# Patient Record
Sex: Male | Born: 1965 | Race: White | Hispanic: No | Marital: Married | Smoking: Former smoker
Health system: Southern US, Community
[De-identification: ages and names within clinical notes are randomized; demographics above are authoritative.]

## PROBLEM LIST (undated history)

## (undated) DIAGNOSIS — C439 Malignant melanoma of skin, unspecified: Secondary | ICD-10-CM

## (undated) DIAGNOSIS — N529 Male erectile dysfunction, unspecified: Secondary | ICD-10-CM

## (undated) DIAGNOSIS — R41840 Attention and concentration deficit: Secondary | ICD-10-CM

## (undated) DIAGNOSIS — F329 Major depressive disorder, single episode, unspecified: Secondary | ICD-10-CM

## (undated) DIAGNOSIS — C801 Malignant (primary) neoplasm, unspecified: Secondary | ICD-10-CM

## (undated) DIAGNOSIS — Z87448 Personal history of other diseases of urinary system: Secondary | ICD-10-CM

## (undated) DIAGNOSIS — F419 Anxiety disorder, unspecified: Secondary | ICD-10-CM

## (undated) DIAGNOSIS — F32A Depression, unspecified: Secondary | ICD-10-CM

## (undated) DIAGNOSIS — F909 Attention-deficit hyperactivity disorder, unspecified type: Secondary | ICD-10-CM

## (undated) DIAGNOSIS — K219 Gastro-esophageal reflux disease without esophagitis: Secondary | ICD-10-CM

## (undated) DIAGNOSIS — M722 Plantar fascial fibromatosis: Secondary | ICD-10-CM

## (undated) HISTORY — PX: FRACTURE SURGERY: SHX138

## (undated) HISTORY — PX: MELANOMA EXCISION: SHX5266

## (undated) HISTORY — PX: BACK SURGERY: SHX140

## (undated) HISTORY — PX: OTHER SURGICAL HISTORY: SHX169

---
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
## 2006-01-24 ENCOUNTER — Ambulatory Visit: Payer: Self-pay | Admitting: Orthopedic Surgery

## 2014-07-07 DIAGNOSIS — N529 Male erectile dysfunction, unspecified: Secondary | ICD-10-CM | POA: Insufficient documentation

## 2014-07-07 DIAGNOSIS — Z85820 Personal history of malignant melanoma of skin: Secondary | ICD-10-CM | POA: Insufficient documentation

## 2014-07-07 DIAGNOSIS — M170 Bilateral primary osteoarthritis of knee: Secondary | ICD-10-CM | POA: Insufficient documentation

## 2014-07-07 DIAGNOSIS — Z87448 Personal history of other diseases of urinary system: Secondary | ICD-10-CM | POA: Insufficient documentation

## 2015-09-07 ENCOUNTER — Encounter: Payer: Self-pay | Admitting: *Deleted

## 2015-09-08 ENCOUNTER — Ambulatory Visit: Payer: BC Managed Care – PPO | Admitting: Anesthesiology

## 2015-09-08 ENCOUNTER — Encounter
Admission: RE | Disposition: A | Discharge status: Home / Self Care | Payer: Self-pay | Source: Ambulatory Visit | Attending: Gastroenterology

## 2015-09-08 ENCOUNTER — Ambulatory Visit
Admission: RE | Admit: 2015-09-08 | Discharge: 2015-09-08 | Disposition: A | Discharge status: Home / Self Care | Payer: BC Managed Care – PPO | Source: Ambulatory Visit | Attending: Gastroenterology | Admitting: Gastroenterology

## 2015-09-08 DIAGNOSIS — Z87891 Personal history of nicotine dependence: Secondary | ICD-10-CM | POA: Insufficient documentation

## 2015-09-08 DIAGNOSIS — R1310 Dysphagia, unspecified: Secondary | ICD-10-CM | POA: Diagnosis not present

## 2015-09-08 DIAGNOSIS — K5730 Diverticulosis of large intestine without perforation or abscess without bleeding: Secondary | ICD-10-CM | POA: Diagnosis not present

## 2015-09-08 DIAGNOSIS — K2100 Gastro-esophageal reflux disease with esophagitis, without bleeding: Secondary | ICD-10-CM | POA: Insufficient documentation

## 2015-09-08 DIAGNOSIS — R197 Diarrhea, unspecified: Secondary | ICD-10-CM | POA: Insufficient documentation

## 2015-09-08 DIAGNOSIS — N529 Male erectile dysfunction, unspecified: Secondary | ICD-10-CM | POA: Insufficient documentation

## 2015-09-08 DIAGNOSIS — Z79899 Other long term (current) drug therapy: Secondary | ICD-10-CM | POA: Insufficient documentation

## 2015-09-08 DIAGNOSIS — Z85820 Personal history of malignant melanoma of skin: Secondary | ICD-10-CM | POA: Diagnosis not present

## 2015-09-08 DIAGNOSIS — R159 Full incontinence of feces: Secondary | ICD-10-CM | POA: Insufficient documentation

## 2015-09-08 HISTORY — DX: Malignant (primary) neoplasm, unspecified: C80.1

## 2015-09-08 HISTORY — DX: Personal history of other diseases of urinary system: Z87.448

## 2015-09-08 HISTORY — DX: Male erectile dysfunction, unspecified: N52.9

## 2015-09-08 HISTORY — PX: ESOPHAGOGASTRODUODENOSCOPY (EGD) WITH PROPOFOL: SHX5813

## 2015-09-08 HISTORY — PX: COLONOSCOPY WITH PROPOFOL: SHX5780

## 2015-09-08 HISTORY — DX: Plantar fascial fibromatosis: M72.2

## 2015-09-08 SURGERY — COLONOSCOPY WITH PROPOFOL
Anesthesia: General

## 2015-09-08 MED ORDER — PROPOFOL INFUSION 10 MG/ML OPTIME
INTRAVENOUS | Status: DC | PRN
  Administered 2015-09-08: 160 ug/kg/min via INTRAVENOUS

## 2015-09-08 MED ORDER — SODIUM CHLORIDE 0.9 % IV SOLN: INTRAVENOUS | Status: DC

## 2015-09-08 MED ORDER — MIDAZOLAM HCL 2 MG/2ML IJ SOLN
INTRAMUSCULAR | Status: DC | PRN
  Administered 2015-09-08: 1 mg via INTRAVENOUS

## 2015-09-08 MED ORDER — DIPHENHYDRAMINE HCL 50 MG/ML IJ SOLN
INTRAMUSCULAR | Status: AC
  Filled 2015-09-08: qty 1

## 2015-09-08 MED ORDER — SODIUM CHLORIDE 0.9 % IV SOLN
INTRAVENOUS | Status: DC
  Administered 2015-09-08: 11:00:00 via INTRAVENOUS

## 2015-09-08 MED ORDER — FENTANYL CITRATE (PF) 100 MCG/2ML IJ SOLN
INTRAMUSCULAR | Status: DC | PRN
  Administered 2015-09-08: 50 ug via INTRAVENOUS

## 2015-09-08 NOTE — Anesthesia Preprocedure Evaluation (Signed)
Anesthesia Evaluation  Patient identified by MRN, date of birth, ID band Patient awake    Reviewed: Allergy & Precautions, NPO status , Patient's Chart, lab work & pertinent test results  Airway Mallampati: II       Dental no notable dental hx.    Pulmonary neg pulmonary ROS, former smoker,    Pulmonary exam normal        Cardiovascular Normal cardiovascular exam     Neuro/Psych    GI/Hepatic negative GI ROS, Neg liver ROS,   Endo/Other  negative endocrine ROS  Renal/GU negative Renal ROS     Musculoskeletal negative musculoskeletal ROS (+)   Abdominal Normal abdominal exam  (+)   Peds negative pediatric ROS (+)  Hematology negative hematology ROS (+)   Anesthesia Other Findings   Reproductive/Obstetrics negative OB ROS                             Anesthesia Physical Anesthesia Plan  ASA: II  Anesthesia Plan: General   Post-op Pain Management:    Induction: Intravenous  Airway Management Planned: Nasal Cannula  Additional Equipment:   Intra-op Plan:   Post-operative Plan:   Informed Consent: I have reviewed the patients History and Physical, chart, labs and discussed the procedure including the risks, benefits and alternatives for the proposed anesthesia with the patient or authorized representative who has indicated his/her understanding and acceptance.     Plan Discussed with: CRNA  Anesthesia Plan Comments:         Anesthesia Quick Evaluation

## 2015-09-08 NOTE — Op Note (Signed)
[facility] Patient Name: Alec Hughes Procedure Date: 09/08/2015 10:58 AM MRN: [id number] Account #: [id number] Date of Birth: Aug 29, 1966 Admit Type: Outpatient Age: 49 Room: [facility] ENDO ROOM 3 Gender: Male Note Status: Finalized Procedure:         Colonoscopy Indications:       Chronic diarrhea Providers:         Lupita Dawn. Candace Cruise, MD Referring MD:      Christena Flake. Raechel Ache, MD (Referring MD) Medicines:         Monitored Anesthesia Care Complications:     No immediate complications. Procedure:         Pre-Anesthesia Assessment:                    - Prior to the procedure, a History and Physical was                     performed, and patient medications, allergies and                     sensitivities were reviewed. The patient's tolerance of                     previous anesthesia was reviewed.                    - The risks and benefits of the procedure and the sedation                     options and risks were discussed with the patient. All                     questions were answered and informed consent was obtained.                    - After reviewing the risks and benefits, the patient was                     deemed in satisfactory condition to undergo the procedure.                    After obtaining informed consent, the colonoscope was                     passed under direct vision. Throughout the procedure, the                     patient's blood pressure, pulse, and oxygen saturations                     were monitored continuously. The Colonoscope was                     introduced through the anus and advanced to the the cecum,                     identified by appendiceal orifice and ileocecal valve. The                     colonoscopy was performed without difficulty. The patient                     tolerated the procedure well. The quality of the bowel  preparation was good. Findings:      A few small-mouthed  diverticula were found in the sigmoid colon.       Biopsies for histology were taken with a cold forceps from the entire       colon for evaluation of microscopic colitis.      The exam was otherwise without abnormality. Impression:        - Diverticulosis in the sigmoid colon. Biopsied.                    - The examination was otherwise normal. Recommendation:    - Discharge patient to home.                    - Await pathology results.                    - The findings and recommendations were discussed with the                     patient.                    - The findings and recommendations were discussed with the                     patient. Procedure Code(s): --- Professional ---                    [phone], Colonoscopy, flexible; with biopsy, single or                     multiple Diagnosis Code(s): --- Professional ---                    K52.9, Noninfective gastroenteritis and colitis,                     unspecified                    K57.30, Diverticulosis of large intestine without                     perforation or abscess without bleeding CPT copyright 2014 American Medical Association. All rights reserved. The codes documented in this report are preliminary and upon coder review may  be revised to meet current compliance requirements. Hulen Luster, MD 09/08/2015 11:25:04 AM This report has been signed electronically. Number of Addenda: 0 Note Initiated On: 09/08/2015 10:58 AM Scope Withdrawal Time: 0 hours 5 minutes 48 seconds  Total Procedure Duration: 0 hours 9 minutes 28 seconds       [facility]

## 2015-09-08 NOTE — Transfer of Care (Signed)
Immediate Anesthesia Transfer of Care Note  Patient: Alec Hughes  Procedure(s) Performed: Procedure(s): COLONOSCOPY WITH PROPOFOL (N/A) ESOPHAGOGASTRODUODENOSCOPY (EGD) WITH PROPOFOL (N/A)  Patient Location: PACU and Endoscopy Unit  Anesthesia Type:General  Level of Consciousness: sedated and responds to stimulation  Airway & Oxygen Therapy: Patient Spontanous Breathing and Patient connected to nasal cannula oxygen  Post-op Assessment: Report given to RN and Post -op Vital signs reviewed and stable  Post vital signs: Reviewed and stable  Last Vitals:  Filed Vitals:   09/08/15 1150  BP: 104/72  Pulse: 70  Temp:   Resp: 18    Complications: No apparent anesthesia complications

## 2015-09-08 NOTE — H&P (Signed)
    Primary Care Physician:  Ezequiel Kayser, MD Primary Gastroenterologist:  Dr. Candace Cruise  Pre-Procedure History & Physical: HPI:  Alec Hughes is a 49 y.o. male is here for an EGD/colonoscpy.   Past Medical History  Diagnosis Date  . Cancer     malignant melanoma of skin  . Plantar fasciitis   . Erectile dysfunction of organic origin   . History of hematuria     Past Surgical History  Procedure Laterality Date  . Melanoma excision    . Broken vertebrae    . Orif right femur    . Orif left wrist      Prior to Admission medications   Medication Sig Start Date End Date Taking? Authorizing Provider  buPROPion (WELLBUTRIN XL) 300 MG 24 hr tablet Take 300 mg by mouth daily.   Yes Historical Provider, MD  Ibuprofen-Diphenhydramine Cit (ADVIL PM PO) Take by mouth.   Yes Historical Provider, MD  sildenafil (VIAGRA) 50 MG tablet Take 50 mg by mouth daily as needed for erectile dysfunction.   Yes Historical Provider, MD  zolpidem (AMBIEN) 5 MG tablet Take 5 mg by mouth at bedtime as needed for sleep.   Yes Historical Provider, MD    Allergies as of 08/04/2015  . (Not on File)    Hughes family history on file.  Social History   Social History  . Marital Status: Unknown    Spouse Name: N/A  . Number of Children: N/A  . Years of Education: N/A   Occupational History  . Not on file.   Social History Main Topics  . Smoking status: Former Research scientist (life sciences)  . Smokeless tobacco: Not on file  . Alcohol Use: Not on file  . Drug Use: Not on file  . Sexual Activity: Not on file   Other Topics Concern  . Not on file   Social History Narrative  . Hughes narrative on file    Review of Systems: See HPI, otherwise negative ROS  Physical Exam: BP 120/79 mmHg  Pulse 66  Temp(Src) 96.2 F (35.7 C) (Tympanic)  Resp 20  Ht 6' (1.829 m)  Wt 99.791 kg (220 lb)  BMI 29.83 kg/m2  SpO2 98% General:   Alert,  pleasant and cooperative in NAD Head:  Normocephalic and atraumatic. Neck:  Supple; Hughes  masses or thyromegaly. Lungs:  Clear throughout to auscultation.    Heart:  Regular rate and rhythm. Abdomen:  Soft, nontender and nondistended. Normal bowel sounds, without guarding, and without rebound.   Neurologic:  Alert and  oriented x4;  grossly normal neurologically.  Impression/Plan: Alec Hughes is here for an EGD/colonoscopy to be performed for diarrhea/fecal incontinence.  Risks, benefits, limitations, and alternatives regarding EGD/colonoscopy have been reviewed with the patient.  Questions have been answered.  All parties agreeable.   Alec Hughes, Alec Dawn, MD  09/08/2015, 11:01 AM

## 2015-09-08 NOTE — Anesthesia Postprocedure Evaluation (Signed)
  Anesthesia Post-op Note  Patient: Alec Hughes  Procedure(s) Performed: Procedure(s): COLONOSCOPY WITH PROPOFOL (N/A) ESOPHAGOGASTRODUODENOSCOPY (EGD) WITH PROPOFOL (N/A)  Anesthesia type:General  Patient location: PACU  Post pain: Pain level controlled  Post assessment: Post-op Vital signs reviewed, Patient's Cardiovascular Status Stable, Respiratory Function Stable, Patent Airway and No signs of Nausea or vomiting  Post vital signs: Reviewed and stable  Last Vitals:  Filed Vitals:   09/08/15 1210  BP: 122/83  Pulse: 57  Temp:   Resp: 12    Level of consciousness: awake, alert  and patient cooperative  Complications: No apparent anesthesia complications

## 2015-09-08 NOTE — Op Note (Signed)
[facility] Patient Name: Alec Hughes Procedure Date: 09/08/2015 10:59 AM MRN: [id number] Account #: [id number] Date of Birth: [birth year] Admit Type: Outpatient Age: 49 Room: [facility] ENDO ROOM 3 Gender: Male Note Status: Finalized Procedure:         Upper GI endoscopy Indications:       Dysphagia Providers:         Lupita Dawn. Candace Cruise, MD Referring MD:      Christena Flake. Raechel Ache, MD (Referring MD) Medicines:         Monitored Anesthesia Care Complications:     No immediate complications. Procedure:         Pre-Anesthesia Assessment:                    - Prior to the procedure, a History and Physical was                     performed, and patient medications, allergies and                     sensitivities were reviewed. The patient's tolerance of                     previous anesthesia was reviewed.                    - The risks and benefits of the procedure and the sedation                     options and risks were discussed with the patient. All                     questions were answered and informed consent was obtained.                    After obtaining informed consent, the endoscope was passed                     under direct vision. Throughout the procedure, the                     patient's blood pressure, pulse, and oxygen saturations                     were monitored continuously. The Endoscope was introduced                     through the mouth, and advanced to the second part of                     duodenum. The upper GI endoscopy was accomplished without                     difficulty. The patient tolerated the procedure well. Findings:      LA Grade A (one or more mucosal breaks less than 5 mm, not extending       between tops of 2 mucosal folds) esophagitis was found at the       gastroesophageal junction. Biopsies were taken with a cold forceps for       histology. The scope was withdrawn. Dilation was performed with a       Maloney  dilator with mild resistance at 34 Fr.      The exam was otherwise without abnormality.  The entire examined stomach was normal.      Localized mildly erythematous mucosa and with no stigmata of bleeding       was found in the first part of the duodenum.      The exam of the duodenum was otherwise normal. Impression:        - LA Grade A reflux esophagitis. Biopsied. Dilated.                    - The examination was otherwise normal.                    - Normal stomach.                    - Erythematous duodenopathy. Recommendation:    - Discharge patient to home.                    - Observe patient's clinical course.                    - Await pathology results.                    - Use Prilosec (omeprazole) 20 mg PO daily daily. Procedure Code(s): --- Professional ---                    [phone], Esophagogastroduodenoscopy, flexible, transoral;                     with biopsy, single or multiple                    43450, Dilation of esophagus, by unguided sound or bougie,                     single or multiple passes Diagnosis Code(s): --- Professional ---                    K21.0, Gastro-esophageal reflux disease with esophagitis                    K31.89, Other diseases of stomach and duodenum                    R13.10, Dysphagia, unspecified CPT copyright 2014 American Medical Association. All rights reserved. The codes documented in this report are preliminary and upon coder review may  be revised to meet current compliance requirements. Hulen Luster, MD 09/08/2015 11:10:02 AM This report has been signed electronically. Number of Addenda: 0 Note Initiated On: 09/08/2015 10:59 AM      [facility]

## 2015-09-09 LAB — SURGICAL PATHOLOGY

## 2015-09-10 ENCOUNTER — Encounter: Payer: Self-pay | Admitting: Gastroenterology

## 2015-10-13 DIAGNOSIS — F5101 Primary insomnia: Secondary | ICD-10-CM | POA: Insufficient documentation

## 2016-09-13 ENCOUNTER — Encounter: Payer: Self-pay | Admitting: Family Medicine

## 2016-09-13 ENCOUNTER — Encounter: Payer: Self-pay | Admitting: Urology

## 2016-09-13 ENCOUNTER — Ambulatory Visit (INDEPENDENT_AMBULATORY_CARE_PROVIDER_SITE_OTHER): Payer: BC Managed Care – PPO | Admitting: Urology

## 2016-09-13 VITALS — BP 102/70 | HR 67 | Ht 73.0 in | Wt 215.6 lb

## 2016-09-13 DIAGNOSIS — Z3009 Encounter for other general counseling and advice on contraception: Secondary | ICD-10-CM | POA: Diagnosis not present

## 2016-09-13 MED ORDER — DIAZEPAM 10 MG PO TABS: 10.0000 mg | ORAL_TABLET | Freq: Once | ORAL | 0 refills | Status: AC

## 2016-09-13 NOTE — Progress Notes (Signed)
09/13/2016 9:28 AM   Alec Hughes 07/19/1966 [id number]  Referring provider: Ezequiel Kayser, MD [place] [facility] [place], [place] [address]  Chief Complaint  Patient presents with  . VAS Consult    HPI: The patient is a 50 year old gentleman presents today to discuss a vasectomy.     PMH: Past Medical History:  Diagnosis Date  . Cancer ([place])    malignant melanoma of skin  . Erectile dysfunction of organic origin   . History of hematuria   . Plantar fasciitis     Surgical History: Past Surgical History:  Procedure Laterality Date  . broken vertebrae    . COLONOSCOPY WITH PROPOFOL N/A 09/08/2015   Procedure: COLONOSCOPY WITH PROPOFOL;  Surgeon: Hulen Luster, MD;  Location: [facility] ENDOSCOPY;  Service: Gastroenterology;  Laterality: N/A;  . ESOPHAGOGASTRODUODENOSCOPY (EGD) WITH PROPOFOL N/A 09/08/2015   Procedure: ESOPHAGOGASTRODUODENOSCOPY (EGD) WITH PROPOFOL;  Surgeon: Hulen Luster, MD;  Location: [facility] ENDOSCOPY;  Service: Gastroenterology;  Laterality: N/A;  . MELANOMA EXCISION    . orif left wrist    . orif right femur      Home Medications:    Medication List       Accurate as of 09/13/16  9:28 AM. Always use your most recent med list.          ADVIL PM PO Take by mouth.   buPROPion 300 MG 24 hr tablet Commonly known as:  WELLBUTRIN XL Take 300 mg by mouth daily.   omeprazole 20 MG capsule Commonly known as:  PRILOSEC Take by mouth.   sildenafil 20 MG tablet Commonly known as:  REVATIO Take by mouth.   sildenafil 50 MG tablet Commonly known as:  VIAGRA Take by mouth.   zolpidem 5 MG tablet Commonly known as:  AMBIEN Take 5 mg by mouth at bedtime as needed for sleep.       Allergies: No Known Allergies  Family History: Family History  Problem Relation Age of Onset  . Prostate cancer Neg Hx   . Bladder Cancer Neg Hx   . Kidney cancer Neg Hx     Social History:  reports that he has quit smoking. He has never used  smokeless tobacco. He reports that he drinks about 8.4 oz of alcohol per week . He reports that he does not use drugs.  ROS: UROLOGY Frequent Urination?: No Hard to postpone urination?: No Burning/pain with urination?: No Get up at night to urinate?: Yes Leakage of urine?: No Urine stream starts and stops?: No Trouble starting stream?: No Do you have to strain to urinate?: No Blood in urine?: Yes Urinary tract infection?: No Sexually transmitted disease?: No Injury to kidneys or bladder?: No Painful intercourse?: No Weak stream?: No Erection problems?: Yes Penile pain?: No  Gastrointestinal Nausea?: No Vomiting?: No Indigestion/heartburn?: Yes Diarrhea?: Yes Constipation?: No  Constitutional Fever: No Night sweats?: No Weight loss?: No Fatigue?: No  Skin Skin rash/lesions?: No Itching?: No  Eyes Blurred vision?: No Double vision?: No  Ears/Nose/Throat Sore throat?: No Sinus problems?: No  Hematologic/Lymphatic Swollen glands?: No Easy bruising?: No  Cardiovascular Leg swelling?: No Chest pain?: No  Respiratory Cough?: No Shortness of breath?: No  Endocrine Excessive thirst?: No  Musculoskeletal Back pain?: No Joint pain?: No  Neurological Headaches?: No Dizziness?: No  Psychologic Depression?: No Anxiety?: No  Physical Exam: BP 102/70   Pulse 67   Ht 6\' 1"  (1.854 m)   Wt 215 lb 9.6 oz (97.8 kg)  BMI 28.44 kg/m   Constitutional:  Alert and oriented, No acute distress. HEENT: [place] AT, moist mucus membranes.  Trachea midline, no masses. Cardiovascular: No clubbing, cyanosis, or edema. Respiratory: Normal respiratory effort, no increased work of breathing. GI: Abdomen is soft, nontender, nondistended, no abdominal masses GU: No CVA tenderness. Normal phallus. Vas deferens palpable bilaterally. Skin: No rashes, bruises or suspicious lesions. Lymph: No cervical or inguinal adenopathy. Neurologic: Grossly intact, no focal deficits,  moving all 4 extremities. Psychiatric: Normal mood and affect.  Laboratory Data: No results found for: WBC, HGB, HCT, MCV, PLT  No results found for: CREATININE  No results found for: PSA  No results found for: TESTOSTERONE  No results found for: HGBA1C  Urinalysis No results found for: COLORURINE, APPEARANCEUR, LABSPEC, PHURINE, GLUCOSEU, HGBUR, BILIRUBINUR, KETONESUR, PROTEINUR, UROBILINOGEN, NITRITE, LEUKOCYTESUR    Assessment & Plan:    Today, we discussed what the vas deferens is, where it is located, and its function. We reviewed the procedure for vasectomy, it's risks, benefits, alternatives, and likelihood of achieving his goals. We discussed in detail the procedure, complications, and recovery as well as the need for clearance prior to unprotected intercourse. We discussed that vasectomy does not protect against sexually transmitted diseases. We discussed that this procedure does not result in immediate sterility and that they would need to use other forms of birth control until he has been cleared with negative postvasectomy semen analyses. I explained that the procedure is considered to be permanent and that attempts at reversal have varying degrees of success. These options include vasectomy reversal, sperm retrieval, and in vitro fertilization; these can be very expensive. We discussed the chance of postvasectomy pain syndrome which occurs in less than 5% of patients. I explained to the patient that there is no treatment to resolve this chronic pain, and that if it developed I would not be able to help resolve the issue, but that surgery is generally not needed for correction. I explained there have even been reports of systemic like illness associated with this chronic pain, and that there was no good cure. I explained that vasectomy it is not a 100% reliable form of birth control, and the risk of pregnancy after vasectomy is approximately 1 in 2000 men who had a negative  postvasectomy semen analysis or rare non-motile sperm. I explained that repeat vasectomy was necessary in less than 1% of vasectomy procedures when employing the type of technique that I use. I explained that he should refrain from ejaculation for approximately one week following vasectomy. I explained that there are other options for birth control which are permanent and non-permanent; we discussed these. I explained the rates of surgical complications, such as symptomatic hematoma or infection, are low (1-2%) and vary with the surgeon's experience and criteria used to diagnose the complication.  The patient had the opportunity to ask questions to his stated satisfaction. He voiced understanding of the above factors and stated that he has read all the information provided to him and the packets and informed consent  1. Family planning -vasectomy  Nickie Retort, [facility] [address] [place], [place] [address] [phone]

## 2016-09-21 ENCOUNTER — Ambulatory Visit (INDEPENDENT_AMBULATORY_CARE_PROVIDER_SITE_OTHER): Payer: BC Managed Care – PPO | Admitting: Urology

## 2016-09-21 VITALS — BP 147/72 | HR 166 | Ht 73.0 in | Wt 215.0 lb

## 2016-09-21 DIAGNOSIS — Z302 Encounter for sterilization: Secondary | ICD-10-CM

## 2016-09-21 NOTE — Progress Notes (Signed)
09/21/16  HPI: 50 yo M with 2 children who desires no further biological pregnancies. Risks and benefits of vasectomy were previously reviewed. This was reviewed again today in detail. All his questions were answered. He is elected to proceed.  Patient's name and identity was confirmed prior to the procedure.  Bilateral Vasectomy Procedure  Pre-Procedure: - Patient's scrotum was prepped and draped for vasectomy. - The vas was palpated through the scrotal skin on the left. - 1% Xylocaine was injected into the skin and surrounding tissue for placement  - In a similar manner, the vas on the right was identified, anesthetized, and stabilized.  Procedure: - A bladeless technique was used to open the overlying skin (sharp dissection) - The left vas was isolated and brought up through the incision exposing that structure. - Bleeding points were cauterized as they occurred. - The vas was free from the surrounding structures and brought to the view. - A segment was positioned for placement with a hemostat. - A second hemostat was placed and a small segment between the two hemostats and was removed for inspection. - Each end of the transected vas lumen was fulgurated/ obliterated using needlepoint electrocautery -A fascial interposition was performed on testicular end of the vas using #3-0 chromic suture -The same procedure was performed on the right. - A single suture of #3-0 chromic catgut was used to close each lateral scrotal skin incision - A dressing was applied.  Post-Procedure: - Patient was instructed in care of the operative area - A specimen is to be delivered in 12 weeks   -Another form of contraception is to be used until   Hollice Espy, MD

## 2017-03-28 ENCOUNTER — Other Ambulatory Visit
Admission: RE | Admit: 2017-03-28 | Discharge: 2017-03-28 | Disposition: A | Discharge status: Home / Self Care | Payer: BC Managed Care – PPO | Source: Ambulatory Visit | Attending: Gastroenterology | Admitting: Gastroenterology

## 2017-03-28 DIAGNOSIS — R197 Diarrhea, unspecified: Secondary | ICD-10-CM | POA: Diagnosis present

## 2017-03-28 LAB — GASTROINTESTINAL PANEL BY PCR, STOOL (REPLACES STOOL CULTURE)

## 2017-03-28 LAB — C DIFFICILE QUICK SCREEN W PCR REFLEX
C DIFFICILE (CDIFF) TOXIN: NEGATIVE
C Diff antigen: NEGATIVE
C Diff interpretation: NOT DETECTED

## 2018-08-14 LAB — HM HIV SCREENING LAB: HM HIV Screening: NEGATIVE

## 2019-08-26 ENCOUNTER — Other Ambulatory Visit: Payer: Self-pay | Admitting: Surgery

## 2019-08-26 ENCOUNTER — Other Ambulatory Visit (HOSPITAL_COMMUNITY): Payer: Self-pay | Admitting: Surgery

## 2019-08-26 DIAGNOSIS — K439 Ventral hernia without obstruction or gangrene: Secondary | ICD-10-CM

## 2019-08-26 DIAGNOSIS — K429 Umbilical hernia without obstruction or gangrene: Secondary | ICD-10-CM

## 2019-09-01 ENCOUNTER — Other Ambulatory Visit: Payer: Self-pay

## 2019-09-01 ENCOUNTER — Ambulatory Visit
Admission: RE | Admit: 2019-09-01 | Discharge: 2019-09-01 | Disposition: A | Discharge status: Home / Self Care | Payer: BC Managed Care – PPO | Source: Ambulatory Visit | Attending: Surgery | Admitting: Surgery

## 2019-09-01 ENCOUNTER — Ambulatory Visit: Admission: RE | Admit: 2019-09-01 | Payer: BC Managed Care – PPO | Source: Ambulatory Visit

## 2019-09-01 DIAGNOSIS — K439 Ventral hernia without obstruction or gangrene: Secondary | ICD-10-CM | POA: Insufficient documentation

## 2019-09-01 DIAGNOSIS — K429 Umbilical hernia without obstruction or gangrene: Secondary | ICD-10-CM | POA: Diagnosis present

## 2019-09-02 ENCOUNTER — Ambulatory Visit: Payer: Self-pay | Admitting: Surgery

## 2019-09-02 NOTE — H&P (Signed)
Subjective:   CC: Umbilical hernia without obstruction and without gangrene [K42.9]  HPI:  Alec Hughes is a 53 y.o. male who was referred by Mancel Bale, MD for evaluation of above. Symptoms were first noted several years ago. Pain is dull and discomfort, confined to the midline along lump, without radiation.  Associated with nothing specific, exacerbated by exertion.  Lump is reducible when laying supine. Patient has no symptoms of  chronic constipation.    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  Past Surgical History:       Past Surgical History:  Procedure Laterality Date  . Broken vertebrae, questionable broken neck repair  1980  . CIRCUMCISION NEWBORN    . COLONOSCOPY  09/08/2015   Diverticulosis/Negative biopsy/Repeat 64yrs/PYO  . EGD  09/08/2015   Reflux Esophagitis/GERD/No Repeat/PYO  . Melanoma removal under right ear  March 2005  . ORIF left wrist  1995   from [place]  . ORIF right femur  1995   from [place]  . VASECTOMY  09/2017    Family History: family history includes Cancer in his paternal aunt; Diabetes in his paternal grandmother; Diabetes type II in his maternal grandmother; Liver cancer in his maternal grandfather; Lung cancer in his paternal grandfather; No Known Problems in his daughter, father, and son; Other in his mother.  Social History:  reports that he quit smoking about 26 years ago. His smoking use included cigarettes. He has a 9.00 pack-year smoking history. He has never used smokeless tobacco. He reports current alcohol use of about 22.0 standard drinks of alcohol per week. He reports that he does not use drugs.  Current Medications: has a current medication list which includes the  following prescription(s): bupropion, ibuprofen, omeprazole, and sildenafil.  Allergies:     Allergies as of 08/26/2019  . (No Known Allergies)    ROS:  A 15 point review of systems was performed and pertinent positives and negatives noted in HPI   Objective:     BP 124/85   Pulse 75   Ht 182.9 cm (6')   Wt (!) 109.2 kg (240 lb 12.8 oz)   BMI 32.66 kg/m   Constitutional :  alert, appears stated age, cooperative and no distress  Lymphatics/Throat:  no asymmetry, masses, or scars  Respiratory:  clear to auscultation bilaterally  Cardiovascular:  regular rate and rhythm  Gastrointestinal: soft, non-tender; bowel sounds normal; no masses,  no organomegaly. umbilical hernia noted.  moderate, reducible and TTP. Diastasis vs ventral hernia in supraumbilical region  Musculoskeletal: Steady gait and movement  Skin: Cool and moist  Psychiatric: Normal affect, non-agitated, not confused       LABS:  n/a   RADS: CLINICAL DATA: Hernia on physical exam  EXAM: CT ABDOMEN AND PELVIS WITHOUT CONTRAST  TECHNIQUE: Multidetector CT imaging of the abdomen and pelvis was performed following the standard protocol without IV contrast.  COMPARISON: None.  FINDINGS: Lower chest: No acute abnormality.  Hepatobiliary: No focal liver abnormality is seen. No gallstones, gallbladder wall thickening, or biliary dilatation.  Pancreas: Unremarkable. No pancreatic ductal dilatation or surrounding inflammatory changes.  Spleen: Normal in size without focal abnormality.  Adrenals/Urinary Tract: Adrenal glands are unremarkable. Kidneys are normal, without renal calculi, focal lesion, or hydronephrosis. Bladder is unremarkable.  Stomach/Bowel: Very mild diverticulosis is noted without evidence of diverticulitis.  There is an anterior abdominal wall hernia containing omental fat as well as a loop of mid transverse colon. No obstructive changes are seen. The appendix is within normal  limits. No small bowel abnormality is noted.  Vascular/Lymphatic: Aortic atherosclerosis. No enlarged abdominal or pelvic lymph nodes.  Reproductive: Prostate is unremarkable.  Other: Anterior abdominal wall hernia as previously described containing a loop of mid transverse colon. No incarceration is noted. Small fat containing hernia at the level of the umbilicus is noted as well.  Musculoskeletal: Proximal right femoral surgery changes are seen. No acute bony abnormality is noted.  IMPRESSION: Two anterior abdominal wall hernias. One at the level of the umbilicus contains only fat. The second is superior to the umbilicus and contains a loop of transverse colon as well as well omental fat without evidence of incarceration.  Diverticulosis without diverticulitis.   Electronically Signed  By: Inez Catalina M.D.  On: 09/01/2019 15:43  Other Result Information  Interface, Rad Results In - 09/01/2019  3:45 PM EDT CLINICAL DATA:  Hernia on physical exam  EXAM: CT ABDOMEN AND PELVIS WITHOUT CONTRAST  TECHNIQUE: Multidetector CT imaging of the abdomen and pelvis was performed following the standard protocol without IV contrast.  COMPARISON:  None.  FINDINGS: Lower chest: No acute abnormality.  Hepatobiliary: No focal liver abnormality is seen. No gallstones, gallbladder wall thickening, or biliary dilatation.  Pancreas: Unremarkable. No pancreatic ductal dilatation or surrounding inflammatory changes.  Spleen: Normal in size without focal abnormality.  Adrenals/Urinary Tract: Adrenal glands are unremarkable. Kidneys are normal, without renal calculi, focal lesion, or hydronephrosis. Bladder is unremarkable.  Stomach/Bowel: Very mild diverticulosis is noted without evidence of diverticulitis. There is an anterior abdominal wall hernia containing omental fat as well as a loop of mid transverse colon. No obstructive changes are seen. The appendix is within normal  limits. No small bowel abnormality is noted.  Vascular/Lymphatic: Aortic atherosclerosis. No enlarged abdominal or pelvic lymph nodes.  Reproductive: Prostate is unremarkable.  Other: Anterior abdominal wall hernia as previously described containing a loop of mid transverse colon. No incarceration is noted. Small fat containing hernia at the level of the umbilicus is noted as well.  Musculoskeletal: Proximal right femoral surgery changes are seen. No acute bony abnormality is noted.  IMPRESSION: Two anterior abdominal wall hernias. One at the level of the umbilicus contains only fat. The second is superior to the umbilicus and contains a loop of transverse colon as well as well omental fat without evidence of incarceration.  Diverticulosis without diverticulitis.   Electronically Signed   By: Inez Catalina M.D.   On: 09/01/2019 15:43    Assessment:       Umbilical hernia without obstruction and without gangrene [K42.9] and ventral hernia  Plan:  Recommended robotic repair of both defects at same time.  Risk of colon injury discussed due to involvement of the colon in the bigger hernia. Plan:  Discussed the risk of surgery including recurrence, which can be up to 50% in the case of incisional or complex hernias, possible use of prosthetic materials (mesh) and the increased risk of mesh infxn if used, bleeding, chronic pain, post-op infxn, post-op SBO or ileus, and possible re-operation to address said risks. The risks of general anesthetic, if used, includes MI, CVA, sudden death or even reaction to anesthetic medications also discussed. Alternatives include continued observation.  Benefits include possible symptom relief, prevention of incarceration, strangulation, enlargement in size over time, and the risk of emergency surgery in the face  of strangulation.   Typical post-op recovery time of 3-5 days with 4-6 weeks of activity restrictions were also discussed.  ED return  precautions given for sudden increase in pain, size of hernia with accompanying fever, nausea, and/or vomiting.  The patient verbalized understanding and all questions were answered to the patient's satisfaction.   Mechanical and antibiotic bowel prep orders will be placed once surgery date is set.  Will proceed with robotic ventral hernia repair x2

## 2019-09-28 ENCOUNTER — Encounter
Admission: RE | Admit: 2019-09-28 | Discharge: 2019-09-28 | Disposition: A | Discharge status: Home / Self Care | Payer: BC Managed Care – PPO | Source: Ambulatory Visit | Attending: Surgery | Admitting: Surgery

## 2019-09-28 ENCOUNTER — Other Ambulatory Visit: Payer: Self-pay

## 2019-09-28 DIAGNOSIS — Z01812 Encounter for preprocedural laboratory examination: Secondary | ICD-10-CM | POA: Insufficient documentation

## 2019-09-28 DIAGNOSIS — Z20828 Contact with and (suspected) exposure to other viral communicable diseases: Secondary | ICD-10-CM | POA: Insufficient documentation

## 2019-09-28 HISTORY — DX: Gastro-esophageal reflux disease without esophagitis: K21.9

## 2019-09-28 HISTORY — DX: Depression, unspecified: F32.A

## 2019-09-28 HISTORY — DX: Attention and concentration deficit: R41.840

## 2019-09-28 NOTE — Patient Instructions (Addendum)
Your procedure is scheduled on: 10/02/2019 Fri Report to Same Day Surgery 2nd floor medical mall [facility] Entrance-take elevator on left to 2nd floor.  Check in with surgery information desk.) To find out your arrival time please call [phone] between 1PM - 3PM on 10/01/2019 Thurs  Remember: Instructions that are not followed completely may result in serious medical risk, up to and including death, or upon the discretion of your surgeon and anesthesiologist your surgery may need to be rescheduled.    ____ 1. Do not eat food after midnight the night before your procedure. You may drink clear liquids up to 2 hours before you are scheduled to arrive at the hospital for your procedure.  Do not drink clear liquids within 2 hours of your scheduled arrival to the hospital.   ____X___Follow your surgeons instructions regarding diet and bowel prep.  Clear liquids include  --Water or Apple juice without pulp  --Clear carbohydrate beverage such as ClearFast or Gatorade  --Black Coffee or Clear Tea (No milk, no creamers, do not add anything to                  the coffee or Tea Type 1 and type 2 diabetics should only drink water.   ____Ensure clear carbohydrate drink on the way to the hospital for bariatric patients  ____Ensure clear carbohydrate drink 3 hours before surgery.   No gum chewing or hard candies.     __x__ 2. No Alcohol for 24 hours before or after surgery.   __x__3. No Smoking or e-cigarettes for 24 prior to surgery.  Do not use any chewable tobacco products for at least 6 hour prior to surgery   ____  4. Bring all medications with you on the day of surgery if instructed.    __x__ 5. Notify your doctor if there is any change in your medical condition     (cold, fever, infections).    x___6. On the morning of surgery brush your teeth with toothpaste and water.  You may rinse your mouth with mouth wash if you wish.  Do not swallow any toothpaste or mouthwash.   Do not wear  jewelry, make-up, hairpins, clips or nail polish.  Do not wear lotions, powders, or perfumes. You may wear deodorant.  Do not shave 48 hours prior to surgery. Men may shave face and neck.  Do not bring valuables to the hospital.    [facility] is not responsible for any belongings or valuables.               Contacts, dentures or bridgework may not be worn into surgery.  Leave your suitcase in the car. After surgery it may be brought to your room.  For patients admitted to the hospital, discharge time is determined by your                       treatment team.  _  Patients discharged the day of surgery will not be allowed to drive home.  You will need someone to drive you home and stay with you the night of your procedure.    Please read over the following fact sheets that you were given:   [facility] Preparing for Surgery and or MRSA Information   _x___ Take anti-hypertensive listed below, cardiac, seizure, asthma,     anti-reflux and psychiatric medicines. These include:  1. buPROPion (WELLBUTRIN XL) 150 MG 24 hr tablet  2.omeprazole (PRILOSEC) 20 MG capsule the  night before and the morning of surgery  3.  4.  5.  6.  ____Fleets enema or Magnesium Citrate as directed.   _x___ Use CHG Soap or sage wipes as directed on instruction sheet   ____ Use inhalers on the day of surgery and bring to hospital day of surgery  ____ Stop Metformin and Janumet 2 days prior to surgery.    ____ Take 1/2 of usual insulin dose the night before surgery and none on the morning     surgery.   _x___ Follow recommendations from Cardiologist, Pulmonologist or PCP regarding          stopping Aspirin, Coumadin, Plavix ,Eliquis, Effient, or Pradaxa, and Pletal.  X____Stop Anti-inflammatories such as Advil, Aleve, Ibuprofen, Motrin, Naproxen, Naprosyn, Goodies powders or aspirin products. OK to take Tylenol and                          Celebrex.   _x___ Stop supplements until after surgery.  But may  continue Vitamin D, Vitamin B,       and multivitamin.   ____ Bring C-Pap to the hospital.

## 2019-09-29 ENCOUNTER — Other Ambulatory Visit
Admission: RE | Admit: 2019-09-29 | Discharge: 2019-09-29 | Disposition: A | Discharge status: Home / Self Care | Payer: BC Managed Care – PPO | Source: Ambulatory Visit | Attending: Surgery | Admitting: Surgery

## 2019-09-29 DIAGNOSIS — Z01812 Encounter for preprocedural laboratory examination: Secondary | ICD-10-CM | POA: Diagnosis present

## 2019-09-29 DIAGNOSIS — Z20828 Contact with and (suspected) exposure to other viral communicable diseases: Secondary | ICD-10-CM | POA: Diagnosis not present

## 2019-09-29 LAB — SARS CORONAVIRUS 2 (TAT 6-24 HRS): SARS Coronavirus 2: NEGATIVE

## 2019-10-01 MED ORDER — CEFAZOLIN SODIUM-DEXTROSE 2-4 GM/100ML-% IV SOLN
2.0000 g | INTRAVENOUS | Status: AC
  Administered 2019-10-02: 16:00:00 2 g via INTRAVENOUS

## 2019-10-02 ENCOUNTER — Ambulatory Visit: Payer: BC Managed Care – PPO | Admitting: Anesthesiology

## 2019-10-02 ENCOUNTER — Encounter
Admission: RE | Disposition: A | Discharge status: Home / Self Care | Payer: Self-pay | Source: Home / Self Care | Attending: Surgery

## 2019-10-02 ENCOUNTER — Other Ambulatory Visit: Payer: Self-pay

## 2019-10-02 ENCOUNTER — Encounter: Payer: Self-pay | Admitting: *Deleted

## 2019-10-02 ENCOUNTER — Ambulatory Visit
Admission: RE | Admit: 2019-10-02 | Discharge: 2019-10-02 | Disposition: A | Discharge status: Home / Self Care | Payer: BC Managed Care – PPO | Attending: Surgery | Admitting: Surgery

## 2019-10-02 DIAGNOSIS — K2100 Gastro-esophageal reflux disease with esophagitis, without bleeding: Secondary | ICD-10-CM | POA: Diagnosis not present

## 2019-10-02 DIAGNOSIS — Z791 Long term (current) use of non-steroidal anti-inflammatories (NSAID): Secondary | ICD-10-CM | POA: Diagnosis not present

## 2019-10-02 DIAGNOSIS — Z79899 Other long term (current) drug therapy: Secondary | ICD-10-CM | POA: Insufficient documentation

## 2019-10-02 DIAGNOSIS — F329 Major depressive disorder, single episode, unspecified: Secondary | ICD-10-CM | POA: Insufficient documentation

## 2019-10-02 DIAGNOSIS — Z87891 Personal history of nicotine dependence: Secondary | ICD-10-CM | POA: Insufficient documentation

## 2019-10-02 DIAGNOSIS — M170 Bilateral primary osteoarthritis of knee: Secondary | ICD-10-CM | POA: Insufficient documentation

## 2019-10-02 DIAGNOSIS — K436 Other and unspecified ventral hernia with obstruction, without gangrene: Secondary | ICD-10-CM | POA: Diagnosis present

## 2019-10-02 DIAGNOSIS — N528 Other male erectile dysfunction: Secondary | ICD-10-CM | POA: Insufficient documentation

## 2019-10-02 DIAGNOSIS — K439 Ventral hernia without obstruction or gangrene: Secondary | ICD-10-CM

## 2019-10-02 HISTORY — PX: XI ROBOTIC ASSISTED VENTRAL HERNIA: SHX6789

## 2019-10-02 SURGERY — REPAIR, HERNIA, VENTRAL, ROBOT-ASSISTED
Anesthesia: General | Site: Abdomen

## 2019-10-02 MED ORDER — DEXMEDETOMIDINE HCL 200 MCG/2ML IV SOLN
INTRAVENOUS | Status: DC | PRN
  Administered 2019-10-02: 8 ug via INTRAVENOUS

## 2019-10-02 MED ORDER — ONDANSETRON HCL 4 MG/2ML IJ SOLN: 4.0000 mg | Freq: Once | INTRAMUSCULAR | Status: DC | PRN

## 2019-10-02 MED ORDER — ONDANSETRON HCL 4 MG/2ML IJ SOLN
INTRAMUSCULAR | Status: AC
  Filled 2019-10-02: qty 2

## 2019-10-02 MED ORDER — ROCURONIUM BROMIDE 100 MG/10ML IV SOLN
INTRAVENOUS | Status: DC | PRN
  Administered 2019-10-02: 30 mg via INTRAVENOUS
  Administered 2019-10-02: 20 mg via INTRAVENOUS
  Administered 2019-10-02: 45 mg via INTRAVENOUS
  Administered 2019-10-02: 5 mg via INTRAVENOUS

## 2019-10-02 MED ORDER — SUCCINYLCHOLINE CHLORIDE 20 MG/ML IJ SOLN
INTRAMUSCULAR | Status: AC
  Filled 2019-10-02: qty 1

## 2019-10-02 MED ORDER — GABAPENTIN 300 MG PO CAPS
300.0000 mg | ORAL_CAPSULE | ORAL | Status: AC
  Administered 2019-10-02: 14:00:00 300 mg via ORAL

## 2019-10-02 MED ORDER — CEFAZOLIN SODIUM-DEXTROSE 2-4 GM/100ML-% IV SOLN
INTRAVENOUS | Status: AC
  Filled 2019-10-02: qty 100

## 2019-10-02 MED ORDER — ACETAMINOPHEN 325 MG PO TABS: 650.0000 mg | ORAL_TABLET | Freq: Three times a day (TID) | ORAL | 0 refills | Status: AC | PRN

## 2019-10-02 MED ORDER — SODIUM CHLORIDE (PF) 0.9 % IJ SOLN
INTRAMUSCULAR | Status: AC
  Filled 2019-10-02: qty 50

## 2019-10-02 MED ORDER — DEXAMETHASONE SODIUM PHOSPHATE 10 MG/ML IJ SOLN
INTRAMUSCULAR | Status: AC
  Filled 2019-10-02: qty 1

## 2019-10-02 MED ORDER — CELECOXIB 200 MG PO CAPS
200.0000 mg | ORAL_CAPSULE | ORAL | Status: AC
  Administered 2019-10-02: 14:00:00 200 mg via ORAL

## 2019-10-02 MED ORDER — FENTANYL CITRATE (PF) 100 MCG/2ML IJ SOLN
INTRAMUSCULAR | Status: AC
  Filled 2019-10-02: qty 2

## 2019-10-02 MED ORDER — DEXAMETHASONE SODIUM PHOSPHATE 10 MG/ML IJ SOLN
INTRAMUSCULAR | Status: DC | PRN
  Administered 2019-10-02: 10 mg via INTRAVENOUS

## 2019-10-02 MED ORDER — LACTATED RINGERS IV SOLN
INTRAVENOUS | Status: DC
  Administered 2019-10-02 (×2): via INTRAVENOUS

## 2019-10-02 MED ORDER — PROPOFOL 500 MG/50ML IV EMUL
INTRAVENOUS | Status: AC
  Filled 2019-10-02: qty 50

## 2019-10-02 MED ORDER — FENTANYL CITRATE (PF) 100 MCG/2ML IJ SOLN
INTRAMUSCULAR | Status: DC | PRN
  Administered 2019-10-02 (×7): 50 ug via INTRAVENOUS

## 2019-10-02 MED ORDER — CHLORHEXIDINE GLUCONATE CLOTH 2 % EX PADS: 6.0000 | MEDICATED_PAD | Freq: Once | CUTANEOUS | Status: DC

## 2019-10-02 MED ORDER — GABAPENTIN 300 MG PO CAPS
ORAL_CAPSULE | ORAL | Status: AC
  Administered 2019-10-02: 14:00:00 300 mg via ORAL
  Filled 2019-10-02: qty 1

## 2019-10-02 MED ORDER — BUPIVACAINE LIPOSOME 1.3 % IJ SUSP
INTRAMUSCULAR | Status: AC
  Filled 2019-10-02: qty 20

## 2019-10-02 MED ORDER — SODIUM CHLORIDE 0.9 % IV SOLN
INTRAVENOUS | Status: DC | PRN
  Administered 2019-10-02: 60 mL

## 2019-10-02 MED ORDER — SUGAMMADEX SODIUM 500 MG/5ML IV SOLN
INTRAVENOUS | Status: AC
  Filled 2019-10-02: qty 5

## 2019-10-02 MED ORDER — FENTANYL CITRATE (PF) 100 MCG/2ML IJ SOLN
INTRAMUSCULAR | Status: AC
  Administered 2019-10-02: 25 ug via INTRAVENOUS
  Filled 2019-10-02: qty 2

## 2019-10-02 MED ORDER — SEVOFLURANE IN SOLN
RESPIRATORY_TRACT | Status: AC
  Filled 2019-10-02: qty 250

## 2019-10-02 MED ORDER — DOCUSATE SODIUM 100 MG PO CAPS: 100.0000 mg | ORAL_CAPSULE | Freq: Two times a day (BID) | ORAL | 0 refills | Status: AC | PRN

## 2019-10-02 MED ORDER — PROPOFOL 10 MG/ML IV BOLUS
INTRAVENOUS | Status: DC | PRN
  Administered 2019-10-02: 200 mg via INTRAVENOUS

## 2019-10-02 MED ORDER — FENTANYL CITRATE (PF) 250 MCG/5ML IJ SOLN
INTRAMUSCULAR | Status: AC
  Filled 2019-10-02: qty 5

## 2019-10-02 MED ORDER — IBUPROFEN 800 MG PO TABS: 800.0000 mg | ORAL_TABLET | Freq: Three times a day (TID) | ORAL | 0 refills | Status: AC | PRN

## 2019-10-02 MED ORDER — HYDROCODONE-ACETAMINOPHEN 5-325 MG PO TABS: 1.0000 | ORAL_TABLET | Freq: Four times a day (QID) | ORAL | 0 refills | Status: AC | PRN

## 2019-10-02 MED ORDER — ACETAMINOPHEN 500 MG PO TABS
ORAL_TABLET | ORAL | Status: AC
  Administered 2019-10-02: 1000 mg via ORAL
  Filled 2019-10-02: qty 2

## 2019-10-02 MED ORDER — BUPIVACAINE HCL (PF) 0.5 % IJ SOLN
INTRAMUSCULAR | Status: AC
  Filled 2019-10-02: qty 30

## 2019-10-02 MED ORDER — BUPIVACAINE-EPINEPHRINE (PF) 0.5% -1:200000 IJ SOLN
INTRAMUSCULAR | Status: AC
  Filled 2019-10-02: qty 30

## 2019-10-02 MED ORDER — MIDAZOLAM HCL 2 MG/2ML IJ SOLN
INTRAMUSCULAR | Status: AC
  Filled 2019-10-02: qty 2

## 2019-10-02 MED ORDER — ONDANSETRON HCL 4 MG/2ML IJ SOLN
INTRAMUSCULAR | Status: DC | PRN
  Administered 2019-10-02: 4 mg via INTRAVENOUS

## 2019-10-02 MED ORDER — BUPIVACAINE-EPINEPHRINE 0.5% -1:200000 IJ SOLN
INTRAMUSCULAR | Status: DC | PRN
  Administered 2019-10-02: 30 mL

## 2019-10-02 MED ORDER — ROCURONIUM BROMIDE 50 MG/5ML IV SOLN
INTRAVENOUS | Status: AC
  Filled 2019-10-02: qty 2

## 2019-10-02 MED ORDER — SUCCINYLCHOLINE CHLORIDE 20 MG/ML IJ SOLN
INTRAMUSCULAR | Status: DC | PRN
  Administered 2019-10-02: 120 mg via INTRAVENOUS

## 2019-10-02 MED ORDER — MIDAZOLAM HCL 2 MG/2ML IJ SOLN
INTRAMUSCULAR | Status: DC | PRN
  Administered 2019-10-02: 2 mg via INTRAVENOUS

## 2019-10-02 MED ORDER — FENTANYL CITRATE (PF) 100 MCG/2ML IJ SOLN
25.0000 ug | INTRAMUSCULAR | Status: DC | PRN
  Administered 2019-10-02 (×2): 25 ug via INTRAVENOUS

## 2019-10-02 MED ORDER — CELECOXIB 200 MG PO CAPS
ORAL_CAPSULE | ORAL | Status: AC
  Administered 2019-10-02: 200 mg via ORAL
  Filled 2019-10-02: qty 1

## 2019-10-02 MED ORDER — LIDOCAINE HCL (CARDIAC) PF 100 MG/5ML IV SOSY
PREFILLED_SYRINGE | INTRAVENOUS | Status: DC | PRN
  Administered 2019-10-02: 100 mg via INTRAVENOUS

## 2019-10-02 MED ORDER — LIDOCAINE HCL (PF) 2 % IJ SOLN
INTRAMUSCULAR | Status: AC
  Filled 2019-10-02: qty 10

## 2019-10-02 MED ORDER — SUGAMMADEX SODIUM 500 MG/5ML IV SOLN
INTRAVENOUS | Status: DC | PRN
  Administered 2019-10-02: 225 mg via INTRAVENOUS

## 2019-10-02 MED ORDER — ACETAMINOPHEN 500 MG PO TABS
1000.0000 mg | ORAL_TABLET | ORAL | Status: AC
  Administered 2019-10-02: 14:00:00 1000 mg via ORAL

## 2019-10-02 SURGICAL SUPPLY — 63 items
BLADE SURG SZ11 CARB STEEL (BLADE) ×2 IMPLANT
CANISTER SUCT 1200ML W/VALVE (MISCELLANEOUS) ×2 IMPLANT
CANNULA REDUC XI 12-8 STAPL (CANNULA) ×1
CANNULA REDUCER 12-8 DVNC XI (CANNULA) ×1 IMPLANT
CHLORAPREP W/TINT 26 (MISCELLANEOUS) ×2 IMPLANT
COVER TIP SHEARS 8 DVNC (MISCELLANEOUS) ×1 IMPLANT
COVER TIP SHEARS 8MM DA VINCI (MISCELLANEOUS) ×1
COVER WAND RF STERILE (DRAPES) ×2 IMPLANT
DEFOGGER SCOPE WARMER CLEARIFY (MISCELLANEOUS) ×2 IMPLANT
DERMABOND ADVANCED (GAUZE/BANDAGES/DRESSINGS) ×1
DERMABOND ADVANCED .7 DNX12 (GAUZE/BANDAGES/DRESSINGS) ×1 IMPLANT
DRAPE 3/4 80X56 (DRAPES) ×2 IMPLANT
DRAPE ARM DVNC X/XI (DISPOSABLE) ×3 IMPLANT
DRAPE COLUMN DVNC XI (DISPOSABLE) ×1 IMPLANT
DRAPE DA VINCI XI ARM (DISPOSABLE) ×3
DRAPE DA VINCI XI COLUMN (DISPOSABLE) ×1
ELECT CAUTERY BLADE 6.4 (BLADE) ×2 IMPLANT
ELECT REM PT RETURN 9FT ADLT (ELECTROSURGICAL) ×2
ELECTRODE REM PT RTRN 9FT ADLT (ELECTROSURGICAL) ×1 IMPLANT
ETHIBOND 2 0 GREEN CT 2 30IN (SUTURE) ×4 IMPLANT
GLOVE BIOGEL PI IND STRL 7.0 (GLOVE) ×1 IMPLANT
GLOVE BIOGEL PI INDICATOR 7.0 (GLOVE) ×1
GLOVE SURG SYN 6.5 ES PF (GLOVE) ×2 IMPLANT
GOWN STRL REUS W/ TWL LRG LVL3 (GOWN DISPOSABLE) ×3 IMPLANT
GOWN STRL REUS W/TWL LRG LVL3 (GOWN DISPOSABLE) ×3
GRASPER SUT TROCAR 14GX15 (MISCELLANEOUS) ×2 IMPLANT
IRRIGATOR SUCT 8 DISP DVNC XI (IRRIGATION / IRRIGATOR) IMPLANT
IRRIGATOR SUCTION 8MM XI DISP (IRRIGATION / IRRIGATOR)
IV NS 1000ML (IV SOLUTION)
IV NS 1000ML BAXH (IV SOLUTION) IMPLANT
KIT PINK PAD W/HEAD ARE REST (MISCELLANEOUS) ×2
KIT PINK PAD W/HEAD ARM REST (MISCELLANEOUS) ×1 IMPLANT
LABEL OR SOLS (LABEL) ×2 IMPLANT
MESH VENTRALIGHT ST 6X8 (Mesh General) ×1 IMPLANT
MESH VENTRLGHT ELIP 8X6XMFL (Mesh General) ×1 IMPLANT
NEEDLE HYPO 22GX1.5 SAFETY (NEEDLE) ×2 IMPLANT
NEEDLE VERESS 14GA 120MM (NEEDLE) ×2 IMPLANT
OBTURATOR OPTICAL STANDARD 8MM (TROCAR) ×1
OBTURATOR OPTICAL STND 8 DVNC (TROCAR) ×1
OBTURATOR OPTICALSTD 8 DVNC (TROCAR) ×1 IMPLANT
PACK LAP CHOLECYSTECTOMY (MISCELLANEOUS) ×2 IMPLANT
PENCIL ELECTRO HAND CTR (MISCELLANEOUS) ×2 IMPLANT
SEAL CANN UNIV 5-8 DVNC XI (MISCELLANEOUS) ×2 IMPLANT
SEAL XI 5MM-8MM UNIVERSAL (MISCELLANEOUS) ×2
SOLUTION ELECTROLUBE (MISCELLANEOUS) ×2 IMPLANT
STAPLER CANNULA SEAL DVNC XI (STAPLE) ×1 IMPLANT
STAPLER CANNULA SEAL XI (STAPLE) ×1
SUT DVC VLOC 3-0 CL 6 P-12 (SUTURE) ×4 IMPLANT
SUT MNCRL 4-0 (SUTURE) ×1
SUT MNCRL 4-0 27XMFL (SUTURE) ×1
SUT MNCRL AB 4-0 PS2 18 (SUTURE) ×2 IMPLANT
SUT STRATAFIX 0 PDS+ CT-2 23 (SUTURE) ×4
SUT VIC AB 3-0 SH 27 (SUTURE) ×1
SUT VIC AB 3-0 SH 27X BRD (SUTURE) ×1 IMPLANT
SUT VICRYL 0 AB UR-6 (SUTURE) ×4 IMPLANT
SUT VLOC 90 2/L VL 12 GS22 (SUTURE) ×6 IMPLANT
SUT VLOC 90 S/L VL9 GS22 (SUTURE) ×4 IMPLANT
SUTURE MNCRL 4-0 27XMF (SUTURE) ×1 IMPLANT
SUTURE STRATFX 0 PDS+ CT-2 23 (SUTURE) ×2 IMPLANT
SYR 30ML LL (SYRINGE) ×2 IMPLANT
TRAY FOLEY MTR SLVR 16FR STAT (SET/KITS/TRAYS/PACK) ×2 IMPLANT
TROCAR XCEL NON-BLD 5MMX100MML (ENDOMECHANICALS) ×2 IMPLANT
TUBING EVAC SMOKE HEATED PNEUM (TUBING) ×2 IMPLANT

## 2019-10-02 NOTE — Anesthesia Procedure Notes (Signed)
Procedure Name: Intubation Date/Time: 10/02/2019 4:51 PM Performed by: Aline Brochure, CRNA Pre-anesthesia Checklist: Patient identified, Emergency Drugs available, Suction available and Patient being monitored Patient Re-evaluated:Patient Re-evaluated prior to induction Oxygen Delivery Method: Circle system utilized Preoxygenation: Pre-oxygenation with 100% oxygen Induction Type: IV induction Ventilation: Mask ventilation without difficulty Laryngoscope Size: Mac and 4 Grade View: Grade I Tube type: Oral Tube size: 7.5 mm Number of attempts: 1 Airway Equipment and Method: Stylet Placement Confirmation: ETT inserted through vocal cords under direct vision,  positive ETCO2 and breath sounds checked- equal and bilateral Secured at: 22 cm Tube secured with: Tape Dental Injury: Teeth and Oropharynx as per pre-operative assessment

## 2019-10-02 NOTE — Anesthesia Preprocedure Evaluation (Addendum)
Anesthesia Evaluation  Patient identified by MRN, date of birth, ID band Patient awake    Reviewed: Allergy & Precautions, H&P , NPO status , Patient's Chart, lab work & pertinent test results, reviewed documented beta blocker date and time   Airway Mallampati: II  TM Distance: >3 FB Neck ROM: full    Dental  (+) Teeth Intact   Pulmonary neg pulmonary ROS, former smoker,    Pulmonary exam normal        Cardiovascular Exercise Tolerance: Good negative cardio ROS Normal cardiovascular exam Rhythm:regular Rate:Normal     Neuro/Psych Depression negative neurological ROS  negative psych ROS   GI/Hepatic Neg liver ROS, GERD  Medicated,  Endo/Other  negative endocrine ROS  Renal/GU negative Renal ROS  negative genitourinary   Musculoskeletal   Abdominal   Peds  Hematology negative hematology ROS (+)   Anesthesia Other Findings Past Medical History: No date: Attention deficit No date: Cancer [facility])     Comment:  malignant melanoma of skin No date: Depression No date: Erectile dysfunction of organic origin No date: GERD (gastroesophageal reflux disease) No date: History of hematuria No date: Plantar fasciitis Past Surgical History: No date: broken vertebrae 09/08/2015: COLONOSCOPY WITH PROPOFOL; N/A     Comment:  Procedure: COLONOSCOPY WITH PROPOFOL;  Surgeon: Hulen Luster, MD;  Location: [facility] ENDOSCOPY;  Service:               Gastroenterology;  Laterality: N/A; 09/08/2015: ESOPHAGOGASTRODUODENOSCOPY (EGD) WITH PROPOFOL; N/A     Comment:  Procedure: ESOPHAGOGASTRODUODENOSCOPY (EGD) WITH               PROPOFOL;  Surgeon: Hulen Luster, MD;  Location: [facility]               ENDOSCOPY;  Service: Gastroenterology;  Laterality: N/A; No date: MELANOMA EXCISION No date: orif left wrist No date: orif right femur BMI    Body Mass Index: 32.28 kg/m     Reproductive/Obstetrics negative OB ROS                              Anesthesia Physical Anesthesia Plan  ASA: II  Anesthesia Plan: General ETT   Post-op Pain Management:    Induction:   PONV Risk Score and Plan:   Airway Management Planned:   Additional Equipment:   Intra-op Plan:   Post-operative Plan:   Informed Consent: I have reviewed the patients History and Physical, chart, labs and discussed the procedure including the risks, benefits and alternatives for the proposed anesthesia with the patient or authorized representative who has indicated his/her understanding and acceptance.     Dental Advisory Given  Plan Discussed with: CRNA  Anesthesia Plan Comments:         Anesthesia Quick Evaluation

## 2019-10-02 NOTE — Discharge Instructions (Signed)

## 2019-10-02 NOTE — Interval H&P Note (Signed)
History and Physical Interval Note:  10/02/2019 12:21 PM  [name]  has presented today for surgery, with the diagnosis of K43.9 Ventral Hernia w/o obstruction or gangrene.  The various methods of treatment have been discussed with the patient and family. After consideration of risks, benefits and other options for treatment, the patient has consented to  Procedure(s): XI ROBOTIC [place] (N/A) as a surgical intervention.  The patient's history has been reviewed, patient examined, no change in status, stable for surgery.  I have reviewed the patient's chart and labs.  Questions were answered to the patient's satisfaction.     Tehya Leath Lysle Pearl

## 2019-10-02 NOTE — Anesthesia Post-op Follow-up Note (Signed)
Anesthesia QCDR form completed.        

## 2019-10-02 NOTE — H&P (Signed)
Benjamine Sprague, DO - 08/26/2019 8:15 AM EDT Formatting of this note might be different from the original. Subjective:   CC: Umbilical hernia without obstruction and without gangrene [K42.9]  HPI: Alec Hughes is a 53 y.o. male who was referred by Mancel Bale, MD for evaluation of above. Symptoms were first noted several years ago. Pain is dull and discomfort, confined to the midline along lump, without radiation. Associated with nothing specific, exacerbated by exertion. Lump is reducible when laying supine. Patient has no symptoms of chronic constipation.   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  Past Surgical History:  Past Surgical History:  Procedure Laterality Date  . Broken vertebrae, questionable broken neck repair 1980  . CIRCUMCISION NEWBORN  . COLONOSCOPY 09/08/2015  Diverticulosis/Negative biopsy/Repeat 19yrs/PYO  . EGD 09/08/2015  Reflux Esophagitis/GERD/No Repeat/PYO  . Melanoma removal under right ear March 2005  . ORIF left wrist 1995  from [place]  . ORIF right femur 1995  from [place]  . VASECTOMY 09/2017   Family History: family history includes Cancer in his paternal aunt; Diabetes in his paternal grandmother; Diabetes type II in his maternal grandmother; Liver cancer in his maternal grandfather; Lung cancer in his paternal grandfather; No Known Problems in his daughter, father, and son; Other in his mother.  Social History: reports that he quit smoking about 26 years ago. His smoking use included cigarettes. He has a 9.00 pack-year smoking history. He has never used smokeless tobacco. He reports current alcohol use of about 22.0 standard drinks of alcohol per week. He reports that he does not use drugs.  Current  Medications: has a current medication list which includes the following prescription(s): bupropion, ibuprofen, omeprazole, and sildenafil.  Allergies:  Allergies as of 08/26/2019  . (No Known Allergies)   ROS:  A 15 point review of systems was performed and pertinent positives and negatives noted in HPI  Objective:    BP 124/85  Pulse 75  Ht 182.9 cm (6')  Wt (!) 109.2 kg (240 lb 12.8 oz)  BMI 32.66 kg/m   Constitutional : alert, appears stated age, cooperative and no distress  Lymphatics/Throat: no asymmetry, masses, or scars  Respiratory: clear to auscultation bilaterally  Cardiovascular: regular rate and rhythm  Gastrointestinal: soft, non-tender; bowel sounds normal; no masses, no organomegaly. umbilical hernia noted. moderate, reducible and TTP. Diastasis vs ventral hernia in supraumbilical region  Musculoskeletal: Steady gait and movement  Skin: Cool and moist  Psychiatric: Normal affect, non-agitated, not confused    LABS:  n/a   RADS: n/a Assessment:    Umbilical hernia without obstruction and without gangrene [K42.9] Diastasis vs ventral hernia. Seems to feel like a hernia with palpable defect edges, but very rare he will develop such a large ventral hernia without any previous history of surgery Plan:   will proceed with CT scan to further assess and also for pre-operative planning. If this is diastasis, will send to PT for symptom relief since surgery will require abdominoplasty that is out of my scope of practice. Umbilical hernia can be surgically repaired. Will discuss details of surgery once CT results back.   Called and discussed CT scan results with patient. Recommended robotic repair of both defects at same time. Risk of colon injury discussed due to involvement of the colon in the bigger hernia.  Plan:  Discussed the risk of surgery including recurrence, which can be up to 50% in the case of incisional or complex hernias, possible use of prosthetic  materials (mesh) and the increased risk of mesh infxn if used, bleeding, chronic pain, post-op infxn, post-op SBO or ileus, and possible re-operation to address said risks. The risks of general anesthetic, if used, includes MI, CVA, sudden death or even reaction to anesthetic medications also discussed. Alternatives include continued observation. Benefits include possible symptom relief, prevention of incarceration, strangulation, enlargement in size over time, and the risk of emergency surgery in the face of strangulation.   Typical post-op recovery time of 3-5 days with 4-6 weeks of activity restrictions were also discussed.  ED return precautions given for sudden increase in pain, size of hernia with accompanying fever, nausea, and/or vomiting.  The patient verbalized understanding and all questions were answered to the patient's satisfaction.  Mechanical and antibiotic bowel prep orders will be placed once surgery date is set. Will proceed with robotic ventral hernia repair x2

## 2019-10-02 NOTE — Transfer of Care (Signed)
Immediate Anesthesia Transfer of Care Note  Patient: Alec Hughes  Procedure(s) Performed: XI ROBOTIC ASSISTED VENTRAL HERNIA (N/A Abdomen)  Patient Location: PACU  Anesthesia Type:General  Level of Consciousness: sedated  Airway & Oxygen Therapy: Patient connected to face mask oxygen  Post-op Assessment: Post -op Vital signs reviewed and stable  Post vital signs: stable  Last Vitals:  Vitals Value Taken Time  BP 119/76 10/02/19 1857  Temp    Pulse 73 10/02/19 1857  Resp 16 10/02/19 1857  SpO2 97 % 10/02/19 1857  Vitals shown include unvalidated device data.  Last Pain:  Vitals:   10/02/19 1102  TempSrc: Oral  PainSc: 0-No pain         Complications: No apparent anesthesia complications

## 2019-10-05 ENCOUNTER — Encounter: Payer: Self-pay | Admitting: Surgery

## 2019-10-06 NOTE — Op Note (Signed)
Preoperative diagnosis: Incarcerated ventral hernia x2  Postoperative diagnosis: same  Procedure: Robotic assisted laparoscopic ventral hernia repair x2 with mesh  Anesthesia: general  Surgeon: Benjamine Sprague  Wound Classification: Clean  Specimen: none  Complications: None  Estimated Blood Loss: 105ml  Indications:see HPI  Findings: 1. 2 ventral hernias  4. Tension free repair achieved with 6x8in bard mesh and suture 5. Adequate hemostasis  Description of procedure: The patient was brought to the operating room and general anesthesia was induced. A time-out was completed verifying correct patient, procedure, site, positioning, and implant(s) and/or special equipment prior to beginning this procedure. Antibiotics were administered prior to making the incision. SCDs placed. The anterior abdominal wall was prepped and draped in the standard sterile fashion.   Palmer's point chosen for entry.  Veress needle placed and abdomen insufflated to 15cm without any dramatic increase in pressure.  Needle removed and optiview technique used to place 39mm port at same point.  No injury noted during placement. Exparel was infused in a TAP block. 3 additional ports, 35mm x2 and 34mm, along right lateral aspect placed.  Xi robot then docked into place.  Hernia contents noted and reduced with combination of blunt, sharp dissection with scissors and fenestrated forceps.  Hemostasis achieved throughout this portion.  Once all hernia contents reduced, there was noted to be 2 ventral hernias, separated by a fascial bridge.  Insufflation dropped to 68mm and transfacial suture with 0 stratafix used to primarily close defects under minimal tension. Bard echo plus protected 6x8 mesh was placed within the abdominal cavity through 68mm port and secured to the abdominal wall centered over the defects. The mesh was then circumferentially sutured into the anterior abdominal wall using 2-0 VLock x2.  Any bleeding noted during  this portion was no longer actively bleeding by end of securing mesh and tightening the suture.    Robot was undocked.  Abdomen then desufflated while camera within abdomen to ensure no signs of new bleed prior to removing camera and rest of ports completely.  12 mm port site skin closed using 0 Vicryl for the fascial layer, 3-0 Vicryl for the deep dermal layer in an interrupted fashion and all skin incisions closed with runninrg 4-0 Monocryl in a subcuticular fashion.  All wounds then dressed with Dermabond.  Patient was then successfully awakened and transferred to PACU in stable condition.  At the end of the procedure sponge and instrument counts were correct.

## 2019-10-06 NOTE — Anesthesia Postprocedure Evaluation (Signed)
Anesthesia Post Note  Patient: Alec Hughes  Procedure(s) Performed: XI ROBOTIC ASSISTED VENTRAL HERNIA (N/A Abdomen)  Patient location during evaluation: PACU Anesthesia Type: General Level of consciousness: awake and alert and oriented Pain management: pain level controlled Vital Signs Assessment: post-procedure vital signs reviewed and stable Respiratory status: spontaneous breathing Cardiovascular status: blood pressure returned to baseline Anesthetic complications: no     Last Vitals:  Vitals:   10/02/19 1957 10/02/19 2012  BP: 129/89 (!) 131/95  Pulse: 72 72  Resp:  20  Temp:    SpO2: 94% 97%    Last Pain:  Vitals:   10/02/19 2012  TempSrc:   PainSc: 3                  Marchele Decock

## 2019-10-10 ENCOUNTER — Encounter: Payer: Self-pay | Admitting: Surgery

## 2019-10-28 ENCOUNTER — Other Ambulatory Visit: Payer: Self-pay | Admitting: Podiatry

## 2019-10-29 NOTE — Discharge Instructions (Signed)
[facility] °[facility] ° °POST OPERATIVE INSTRUCTIONS FOR DR. TROXLER, DR. FOWLER, AND DR. BAKER °[facility] ° ° °1. Take your medication as prescribed.  Pain medication should be taken only as needed. ° °2. Keep the dressing clean, dry and intact. ° °3. Keep your foot elevated above the heart level for the first 48 hours. ° °4. Walking to the bathroom and brief periods of walking are acceptable, unless we have instructed you to be non-weight bearing. ° °5. Always wear your post-op shoe when walking.  Always use your crutches if you are to be non-weight bearing. ° °6. Do not take a shower. Baths are permissible as long as the foot is kept out of the water.  ° °7. Every hour you are awake:  °- Bend your knee 15 times. °- Flex foot 15 times °- Massage calf 15 times ° °8. Call [facility] ([phone]) if any of the following problems occur: °- You develop a temperature or fever. °- The bandage becomes saturated with blood. °- Medication does not stop your pain. °- Injury of the foot occurs. °- Any symptoms of infection including redness, odor, or red streaks running from wound. ° °General Anesthesia, Adult, Care After °This sheet gives you information about how to care for yourself after your procedure. Your health care provider may also give you more specific instructions. If you have problems or questions, contact your health care provider. °What can I expect after the procedure? °After the procedure, the following side effects are common: °· Pain or discomfort at the IV site. °· Nausea. °· Vomiting. °· Sore throat. °· Trouble concentrating. °· Feeling cold or chills. °· Weak or tired. °· Sleepiness and fatigue. °· Soreness and body aches. These side effects can affect parts of the body that were not involved in surgery. °Follow these instructions at home: ° °For at least 24 hours after the procedure: °· Have a responsible adult stay with you. It is  important to have someone help care for you until you are awake and alert. °· Rest as needed. °· Do not: °? Participate in activities in which you could fall or become injured. °? Drive. °? Use heavy machinery. °? Drink alcohol. °? Take sleeping pills or medicines that cause drowsiness. °? Make important decisions or sign legal documents. °? Take care of children on your own. °Eating and drinking °· Follow any instructions from your health care provider about eating or drinking restrictions. °· When you feel hungry, start by eating small amounts of foods that are soft and easy to digest (bland), such as toast. Gradually return to your regular diet. °· Drink enough fluid to keep your urine pale yellow. °· If you vomit, rehydrate by drinking water, juice, or clear broth. °General instructions °· If you have sleep apnea, surgery and certain medicines can increase your risk for breathing problems. Follow instructions from your health care provider about wearing your sleep device: °? Anytime you are sleeping, including during daytime naps. °? While taking prescription pain medicines, sleeping medicines, or medicines that make you drowsy. °· Return to your normal activities as told by your health care provider. Ask your health care provider what activities are safe for you. °· Take over-the-counter and prescription medicines only as told by your health care provider. °· If you smoke, do not smoke without supervision. °· Keep all follow-up visits as told by your health care provider. This is important. °Contact a health care provider if: °· You   have nausea or vomiting that does not get better with medicine. °· You cannot eat or drink without vomiting. °· You have pain that does not get better with medicine. °· You are unable to pass urine. °· You develop a skin rash. °· You have a fever. °· You have redness around your IV site that gets worse. °Get help right away if: °· You have difficulty breathing. °· You have chest  pain. °· You have blood in your urine or stool, or you vomit blood. °Summary °· After the procedure, it is common to have a sore throat or nausea. It is also common to feel tired. °· Have a responsible adult stay with you for the first 24 hours after general anesthesia. It is important to have someone help care for you until you are awake and alert. °· When you feel hungry, start by eating small amounts of foods that are soft and easy to digest (bland), such as toast. Gradually return to your regular diet. °· Drink enough fluid to keep your urine pale yellow. °· Return to your normal activities as told by your health care provider. Ask your health care provider what activities are safe for you. °This information is not intended to replace advice given to you by your health care provider. Make sure you discuss any questions you have with your health care provider. °Document Released: 03/18/2001 Document Revised: 12/13/2017 Document Reviewed: 07/26/2017 °Elsevier Patient Education © 2020 [employer] ° °

## 2019-11-02 ENCOUNTER — Other Ambulatory Visit
Admission: RE | Admit: 2019-11-02 | Discharge: 2019-11-02 | Disposition: A | Discharge status: Home / Self Care | Payer: BC Managed Care – PPO | Source: Ambulatory Visit | Attending: Podiatry | Admitting: Podiatry

## 2019-11-02 ENCOUNTER — Other Ambulatory Visit: Payer: Self-pay

## 2019-11-02 DIAGNOSIS — Z01812 Encounter for preprocedural laboratory examination: Secondary | ICD-10-CM | POA: Diagnosis not present

## 2019-11-02 DIAGNOSIS — Z20828 Contact with and (suspected) exposure to other viral communicable diseases: Secondary | ICD-10-CM | POA: Insufficient documentation

## 2019-11-02 LAB — SARS CORONAVIRUS 2 (TAT 6-24 HRS): SARS Coronavirus 2: NEGATIVE

## 2019-11-05 ENCOUNTER — Ambulatory Visit: Payer: BC Managed Care – PPO | Admitting: Anesthesiology

## 2019-11-05 ENCOUNTER — Ambulatory Visit
Admission: RE | Admit: 2019-11-05 | Discharge: 2019-11-05 | Disposition: A | Discharge status: Home / Self Care | Payer: BC Managed Care – PPO | Attending: Podiatry | Admitting: Podiatry

## 2019-11-05 ENCOUNTER — Encounter
Admission: RE | Disposition: A | Discharge status: Home / Self Care | Payer: Self-pay | Source: Home / Self Care | Attending: Podiatry

## 2019-11-05 ENCOUNTER — Other Ambulatory Visit: Payer: Self-pay

## 2019-11-05 DIAGNOSIS — M722 Plantar fascial fibromatosis: Secondary | ICD-10-CM | POA: Insufficient documentation

## 2019-11-05 DIAGNOSIS — K219 Gastro-esophageal reflux disease without esophagitis: Secondary | ICD-10-CM | POA: Insufficient documentation

## 2019-11-05 DIAGNOSIS — Z87891 Personal history of nicotine dependence: Secondary | ICD-10-CM | POA: Diagnosis not present

## 2019-11-05 DIAGNOSIS — F329 Major depressive disorder, single episode, unspecified: Secondary | ICD-10-CM | POA: Insufficient documentation

## 2019-11-05 DIAGNOSIS — Z79899 Other long term (current) drug therapy: Secondary | ICD-10-CM | POA: Insufficient documentation

## 2019-11-05 HISTORY — PX: PLANTAR FASCIA RELEASE: SHX2239

## 2019-11-05 SURGERY — RELEASE, FASCIA, PLANTAR
Anesthesia: General | Site: Foot | Laterality: Right

## 2019-11-05 MED ORDER — GLYCOPYRROLATE 0.2 MG/ML IJ SOLN
INTRAMUSCULAR | Status: AC
  Filled 2019-11-05: qty 1

## 2019-11-05 MED ORDER — ROPIVACAINE HCL 5 MG/ML IJ SOLN
INTRAMUSCULAR | Status: DC | PRN
  Administered 2019-11-05: 40 mL via PERINEURAL

## 2019-11-05 MED ORDER — DEXAMETHASONE SODIUM PHOSPHATE 4 MG/ML IJ SOLN
INTRAMUSCULAR | Status: DC | PRN
  Administered 2019-11-05: 4 mg via INTRAVENOUS

## 2019-11-05 MED ORDER — PROPOFOL 10 MG/ML IV BOLUS
INTRAVENOUS | Status: DC | PRN
  Administered 2019-11-05: 200 mg via INTRAVENOUS

## 2019-11-05 MED ORDER — LIDOCAINE HCL (PF) 2 % IJ SOLN
INTRAMUSCULAR | Status: AC
  Filled 2019-11-05: qty 10

## 2019-11-05 MED ORDER — ONDANSETRON HCL 4 MG/2ML IJ SOLN
INTRAMUSCULAR | Status: DC | PRN
  Administered 2019-11-05: 4 mg via INTRAVENOUS

## 2019-11-05 MED ORDER — POVIDONE-IODINE 7.5 % EX SOLN: Freq: Once | CUTANEOUS | Status: DC

## 2019-11-05 MED ORDER — HYDROCODONE-ACETAMINOPHEN 7.5-325 MG PO TABS: 1.0000 | ORAL_TABLET | Freq: Four times a day (QID) | ORAL | 0 refills | Status: DC | PRN

## 2019-11-05 MED ORDER — ONDANSETRON HCL 4 MG/2ML IJ SOLN: 4.0000 mg | Freq: Once | INTRAMUSCULAR | Status: DC | PRN

## 2019-11-05 MED ORDER — LIDOCAINE HCL (CARDIAC) PF 100 MG/5ML IV SOSY
PREFILLED_SYRINGE | INTRAVENOUS | Status: DC | PRN
  Administered 2019-11-05: 40 mg via INTRATRACHEAL

## 2019-11-05 MED ORDER — FENTANYL CITRATE (PF) 100 MCG/2ML IJ SOLN: 25.0000 ug | INTRAMUSCULAR | Status: DC | PRN

## 2019-11-05 MED ORDER — CEFAZOLIN SODIUM-DEXTROSE 2-4 GM/100ML-% IV SOLN
2.0000 g | INTRAVENOUS | Status: AC
  Administered 2019-11-05: 2 g via INTRAVENOUS

## 2019-11-05 MED ORDER — GLYCOPYRROLATE 0.2 MG/ML IJ SOLN
INTRAMUSCULAR | Status: DC | PRN
  Administered 2019-11-05: 0.1 mg via INTRAVENOUS

## 2019-11-05 MED ORDER — LACTATED RINGERS IV SOLN
10.0000 mL/h | INTRAVENOUS | Status: DC
  Administered 2019-11-05: 10:00:00 10 mL/h via INTRAVENOUS

## 2019-11-05 MED ORDER — FENTANYL CITRATE (PF) 100 MCG/2ML IJ SOLN
INTRAMUSCULAR | Status: DC | PRN
  Administered 2019-11-05: 100 ug via INTRAVENOUS

## 2019-11-05 MED ORDER — MIDAZOLAM HCL 5 MG/5ML IJ SOLN
INTRAMUSCULAR | Status: DC | PRN
  Administered 2019-11-05: 2 mg via INTRAVENOUS

## 2019-11-05 SURGICAL SUPPLY — 37 items
BNDG ELASTIC 4X5.8 VLCR NS LF (GAUZE/BANDAGES/DRESSINGS) ×1 IMPLANT
BNDG ESMARK 4X12 TAN STRL LF (GAUZE/BANDAGES/DRESSINGS) ×2 IMPLANT
BNDG GAUZE 4.5X4.1 6PLY STRL (MISCELLANEOUS) ×2 IMPLANT
BNDG STRETCH 4X75 STRL LF (GAUZE/BANDAGES/DRESSINGS) ×2 IMPLANT
CANISTER SUCT 1200ML W/VALVE (MISCELLANEOUS) ×2 IMPLANT
CAST PADDING 3X4FT ST 30246 (SOFTGOODS) ×2
COVER LIGHT HANDLE UNIVERSAL (MISCELLANEOUS) ×4 IMPLANT
CUFF TOURN SGL QUICK 18X4 (TOURNIQUET CUFF) ×1 IMPLANT
DURAPREP 26ML APPLICATOR (WOUND CARE) ×2 IMPLANT
ELECT REM PT RETURN 9FT ADLT (ELECTROSURGICAL) ×2
ELECTRODE REM PT RTRN 9FT ADLT (ELECTROSURGICAL) ×1 IMPLANT
GAUZE SPONGE 4X4 12PLY STRL (GAUZE/BANDAGES/DRESSINGS) ×2 IMPLANT
GAUZE XEROFORM 1X8 LF (GAUZE/BANDAGES/DRESSINGS) ×2 IMPLANT
GLOVE BIO SURGEON STRL SZ8 (GLOVE) ×2 IMPLANT
GOWN STRL REUS W/ TWL LRG LVL3 (GOWN DISPOSABLE) ×1 IMPLANT
GOWN STRL REUS W/ TWL XL LVL3 (GOWN DISPOSABLE) ×1 IMPLANT
GOWN STRL REUS W/TWL LRG LVL3 (GOWN DISPOSABLE) ×1
GOWN STRL REUS W/TWL XL LVL3 (GOWN DISPOSABLE) ×1
K-WIRE DBL END TROCAR 6X.062 (WIRE) ×2
KIT CARPAL TUNNEL (MISCELLANEOUS) ×1
KIT PRC PRB RTRGD 3ANG KNF HND (MISCELLANEOUS) IMPLANT
KIT TURNOVER KIT A (KITS) ×2 IMPLANT
KWIRE DBL END TROCAR 6X.062 (WIRE) IMPLANT
NS IRRIG 500ML POUR BTL (IV SOLUTION) ×2 IMPLANT
PACK EXTREMITY ARMC (MISCELLANEOUS) ×2 IMPLANT
PAD CAST CTTN 3X4 STRL (SOFTGOODS) IMPLANT
PENCIL SMOKE EVACUATOR (MISCELLANEOUS) ×2 IMPLANT
SOL ANTI-FOG 6CC FOG-OUT (MISCELLANEOUS) IMPLANT
SOL FOG-OUT ANTI-FOG 6CC (MISCELLANEOUS) ×1
SPLINT CAST 1 STEP 4X30 (MISCELLANEOUS) ×2 IMPLANT
STOCKINETTE STRL 6IN 960660 (GAUZE/BANDAGES/DRESSINGS) ×2 IMPLANT
STRAP BODY AND KNEE 60X3 (MISCELLANEOUS) ×2 IMPLANT
SUT ETHILON 3-0 FS-10 30 BLK (SUTURE) ×2
SUTURE EHLN 3-0 FS-10 30 BLK (SUTURE) IMPLANT
WAND TOPAZ EPF  WAS Q (MISCELLANEOUS)
WAND TOPAZ EPF WAS Q (MISCELLANEOUS) IMPLANT
WAND TOPAZ MICRO DEBRIDER (MISCELLANEOUS) ×1 IMPLANT

## 2019-11-05 NOTE — Anesthesia Postprocedure Evaluation (Signed)
Anesthesia Post Note  Patient: Alec Hughes  Procedure(s) Performed: ENDOSCOPIC PLANTAR FASC. RELEASE RIGHT (Right Foot)     Patient location during evaluation: PACU Anesthesia Type: General Level of consciousness: awake and alert and oriented Pain management: satisfactory to patient Vital Signs Assessment: post-procedure vital signs reviewed and stable Respiratory status: spontaneous breathing, nonlabored ventilation and respiratory function stable Cardiovascular status: blood pressure returned to baseline and stable Postop Assessment: Adequate PO intake and No signs of nausea or vomiting Anesthetic complications: no    Raliegh Ip

## 2019-11-05 NOTE — Anesthesia Procedure Notes (Signed)
Procedure Name: LMA Insertion Date/Time: 11/05/2019 11:42 AM Performed by: Mayme Genta, CRNA Pre-anesthesia Checklist: Patient identified, Emergency Drugs available, Suction available, Timeout performed and Patient being monitored Patient Re-evaluated:Patient Re-evaluated prior to induction Oxygen Delivery Method: Circle system utilized Preoxygenation: Pre-oxygenation with 100% oxygen Induction Type: IV induction LMA: LMA inserted LMA Size: 4.0 Number of attempts: 1 Placement Confirmation: positive ETCO2 and breath sounds checked- equal and bilateral Tube secured with: Tape

## 2019-11-05 NOTE — Progress Notes (Signed)
Assisted Mike Stella ANMD with right, ultrasound guided, popliteal/saphenous block. Side rails up, monitors on throughout procedure. See vital signs in flow sheet. Tolerated Procedure well. °

## 2019-11-05 NOTE — Anesthesia Procedure Notes (Signed)
Anesthesia Regional Block: Adductor canal block   Pre-Anesthetic Checklist: ,, timeout performed, Correct Patient, Correct Site, Correct Laterality, Correct Procedure, Correct Position, site marked, Risks and benefits discussed,  Surgical consent,  Pre-op evaluation,  At surgeon's request and post-op pain management  Laterality: Right  Prep: chloraprep       Needles:  Injection technique: Single-shot  Needle Type: Echogenic Needle     Needle Length: 9cm  Needle Gauge: 21     Additional Needles:   Procedures:,,,, ultrasound used (permanent image in chart),,,,  Narrative:  Start time: 11/05/2019 10:51 AM End time: 11/05/2019 11:01 AM Injection made incrementally with aspirations every 5 mL.  Performed by: Personally  Anesthesiologist: Ronelle Nigh, MD  Additional Notes: Functioning IV was confirmed and monitors applied. Ultrasound guidance: relevant anatomy identified, needle position confirmed, local anesthetic spread visualized around nerve(s)., vascular puncture avoided.  Image printed for medical record.  Negative aspiration and no paresthesias; incremental administration of local anesthetic. The patient tolerated the procedure well. Vitals signes recorded in RN notes. 10 ml

## 2019-11-05 NOTE — Transfer of Care (Signed)
**Note Alec-Identified via Obfuscation** Immediate Anesthesia Transfer of Care Note  Patient: Alec Hughes  Procedure(s) Performed: ENDOSCOPIC PLANTAR FASC. RELEASE RIGHT (Right Foot)  Patient Location: PACU  Anesthesia Type: General LMA  Level of Consciousness: awake, alert  and patient cooperative  Airway and Oxygen Therapy: Patient Spontanous Breathing and Patient connected to supplemental oxygen  Post-op Assessment: Post-op Vital signs reviewed, Patient's Cardiovascular Status Stable, Respiratory Function Stable, Patent Airway and No signs of Nausea or vomiting  Post-op Vital Signs: Reviewed and stable  Complications: No apparent anesthesia complications

## 2019-11-05 NOTE — Op Note (Signed)
Operative note   Surgeon: Dr. Albertine Patricia, DPM.    Assistant: None    Preop diagnosis: Chronic plantar fasciitis right    Postop diagnosis: Same    Procedure:   1.  Endoscopic partial plantar fascial release one third medial portion of plantar fascial ligament   2.  Topaz fasciotomy plantar aspect plantar fascial ligament attachment to the calcaneus        EBL: 10 cc    Anesthesia:general delivered by the anesthesia team along with a popliteal block to the right side.    Hemostasis: Calf tourniquet 225 mils mercury pressure for    Specimen: None    Complications: None    Operative indications: Chronic plantar fasciitis resistant to because of cervical cancer including orthotics strappings: Laboratories and cortisone injection    Procedure:  Patient was brought into the OR and placed on the operating table in thesupine position. After anesthesia was obtained theright lower extremity was prepped and draped in usual sterile fashion.  Operative Report: This point attention directed the plantar aspect of the right heel where a gauge needle was used to probe medially from the distal portion of once this was achieved and area was measured approximately 1.6 cm distal to this area was marked and a vertical incision was made approximately 1 cm in length.  This was probed with blunt dissection the plantar fascial ligament was palpated in the region.  A blunt probe was used to carry the track laterally opening in the area for the plantar fascial ligament.  A trocar and cannula was then placed from medial to lateral with the cannula dorsally against the plantar fascial ligament.  This was carried all the way laterally another half centimeter incision made laterally and the trocar and cannula exited at this point.  Trocar was removed this point and the camera was inserted laterally.  Excellent visualization of the plantar fascial ligament was achieved.  This point a triangular blade was used to  release the medial third of the ligament.  This was accomplished until underlying muscle could be identified.  Remaining lateral third of the ligament remained intact.  There was an copiously irrigated the cannula removed.  At this time attention directed to the plantar calcaneal skin.  The area had been mapped out earlier with approximately 25 spots to insert the Topaz wand.  This point a point a 6 2 K wire was used to make percutaneous openings in these previously marked areas.  The wand was then inserted into each region and the plantar fascial ligament was ablated in these areas.  This represented areas across the medial calcaneal tubercle centrally and the lateral calcaneal tubercle.  This time the incisions for the plantar fascial ligament were then irrigated and 4-0 nylon was used to close the skin medially with 2 simple erupted sutures and laterally with 1 simple suture.  At this juncture sterile compressive dressing was placed across the wound consisting of Xeroform gauze 4 x 4's conformer and Kerlix.  Tourniquet was released prior to closure.  Please vascular seen return to the right foot.  Posterior splint was placed on the right foot leg in the operating room.    Patient tolerated the procedure and anesthesia well.  Was transported from the OR to the PACU with all vital signs stable and vascular status intact. To be discharged per routine protocol.  Will follow up in approximately 1 week in the outpatient clinic.

## 2019-11-05 NOTE — Anesthesia Procedure Notes (Signed)
Anesthesia Regional Block: Popliteal block   Pre-Anesthetic Checklist: ,, timeout performed, Correct Patient, Correct Site, Correct Laterality, Correct Procedure, Correct Position, site marked, Risks and benefits discussed,  Surgical consent,  Pre-op evaluation,  At surgeon's request and post-op pain management  Laterality: Right  Prep: chloraprep       Needles:  Injection technique: Single-shot  Needle Type: Echogenic Needle     Needle Length: 9cm  Needle Gauge: 21     Additional Needles:   Procedures:,,,, ultrasound used (permanent image in chart),,,,  Narrative:  Start time: 11/05/2019 10:51 AM End time: 11/05/2019 11:01 AM Injection made incrementally with aspirations every 5 mL.  Performed by: Personally  Anesthesiologist: Ronelle Nigh, MD  Additional Notes: Functioning IV was confirmed and monitors applied. Ultrasound guidance: relevant anatomy identified, needle position confirmed, local anesthetic spread visualized around nerve(s)., vascular puncture avoided.  Image printed for medical record.  Negative aspiration and no paresthesias; incremental administration of local anesthetic. The patient tolerated the procedure well. Vitals signes recorded in RN notes.  50ml

## 2019-11-05 NOTE — Anesthesia Preprocedure Evaluation (Signed)
Anesthesia Evaluation  Patient identified by MRN, date of birth, ID band Patient awake    Reviewed: Allergy & Precautions, H&P , NPO status , Patient's Chart, lab work & pertinent test results  Airway Mallampati: II  TM Distance: >3 FB Neck ROM: full    Dental no notable dental hx.    Pulmonary former smoker,    Pulmonary exam normal breath sounds clear to auscultation       Cardiovascular Normal cardiovascular exam Rhythm:regular Rate:Normal     Neuro/Psych    GI/Hepatic GERD  ,  Endo/Other    Renal/GU      Musculoskeletal   Abdominal   Peds  Hematology   Anesthesia Other Findings   Reproductive/Obstetrics                             Anesthesia Physical Anesthesia Plan  ASA: II  Anesthesia Plan: General LMA   Post-op Pain Management:  Regional for Post-op pain   Induction:   PONV Risk Score and Plan: 2 and Ondansetron, Dexamethasone and Treatment may vary due to age or medical condition  Airway Management Planned:   Additional Equipment:   Intra-op Plan:   Post-operative Plan:   Informed Consent: I have reviewed the patients History and Physical, chart, labs and discussed the procedure including the risks, benefits and alternatives for the proposed anesthesia with the patient or authorized representative who has indicated his/her understanding and acceptance.       Plan Discussed with: CRNA  Anesthesia Plan Comments:         Anesthesia Quick Evaluation

## 2019-11-05 NOTE — H&P (Signed)
H and P has been reviewed and no changes are noted.  

## 2019-11-06 ENCOUNTER — Encounter: Payer: Self-pay | Admitting: Podiatry

## 2020-10-10 ENCOUNTER — Other Ambulatory Visit: Payer: Self-pay | Admitting: Surgery

## 2020-10-10 DIAGNOSIS — K439 Ventral hernia without obstruction or gangrene: Secondary | ICD-10-CM

## 2020-10-18 ENCOUNTER — Other Ambulatory Visit: Payer: Self-pay

## 2020-10-18 ENCOUNTER — Ambulatory Visit
Admission: RE | Admit: 2020-10-18 | Discharge: 2020-10-18 | Disposition: A | Discharge status: Home / Self Care | Payer: BC Managed Care – PPO | Source: Ambulatory Visit | Attending: Surgery | Admitting: Surgery

## 2020-10-18 DIAGNOSIS — K439 Ventral hernia without obstruction or gangrene: Secondary | ICD-10-CM | POA: Insufficient documentation

## 2020-11-30 ENCOUNTER — Other Ambulatory Visit
Admission: RE | Admit: 2020-11-30 | Discharge: 2020-11-30 | Disposition: A | Discharge status: Home / Self Care | Payer: BC Managed Care – PPO | Source: Ambulatory Visit | Attending: Surgery | Admitting: Surgery

## 2020-11-30 ENCOUNTER — Ambulatory Visit: Payer: Self-pay | Admitting: Surgery

## 2020-11-30 NOTE — Patient Instructions (Addendum)
Your procedure is scheduled on: 12/08/20- Thursday Report to the Registration Desk on the 1st floor of the [facility]. To find out your arrival time, please call [phone] between 1PM - 3PM on: 12/07/20- Wednesday  REMEMBER: Instructions that are not followed completely may result in serious medical risk, up to and including death; or upon the discretion of your surgeon and anesthesiologist your surgery may need to be rescheduled.  Do not eat food after midnight the night before surgery.  No gum chewing, lozengers or hard candies.  You may however, drink CLEAR liquids up to 2 hours before you are scheduled to arrive for your surgery. Do not drink anything within 2 hours of your scheduled arrival time.  Clear liquids include: - water  - apple juice without pulp - gatorade (not RED, PURPLE, OR BLUE) - black coffee or tea (Do NOT add milk or creamers to the coffee or tea) Do NOT drink anything that is not on this list.  TAKE THESE MEDICATIONS THE MORNING OF SURGERY WITH A SIP OF WATER: - omeprazole (PRILOSEC) 20 MG capsule, take one the night before and one on the morning of surgery - helps to prevent nausea after surgery.  One week prior to surgery: Stop taking 11/30/20. Stop Anti-inflammatories (NSAIDS) such as Advil, Aleve, Ibuprofen, Motrin, Naproxen, Naprosyn and Aspirin based products such as Excedrin, Goodys Powder, BC Powder . Stop ANY OVER THE COUNTER supplements starting 11/30/20 until after surgery , omega-3 fish oil (MAXEPA) 1000 MG CAPS capsule (However, you may continue taking Vitamin D, Vitamin B, and multivitamin up until the day before surgery.)  No Alcohol for 24 hours before or after surgery.  No Smoking including e-cigarettes for 24 hours prior to surgery.  No chewable tobacco products for at least 6 hours prior to surgery.  No nicotine patches on the day of surgery.  Do not use any "recreational" drugs for at least a week prior to your surgery.  Please be  advised that the combination of cocaine and anesthesia may have negative outcomes, up to and including death. If you test positive for cocaine, your surgery will be cancelled.  On the morning of surgery brush your teeth with toothpaste and water, you may rinse your mouth with mouthwash if you wish. Do not swallow any toothpaste or mouthwash.  Do not wear jewelry, make-up, hairpins, clips or nail polish.  Do not wear lotions, powders, or perfumes.   Do not shave body from the neck down 48 hours prior to surgery just in case you cut yourself which could leave a site for infection.  Also, freshly shaved skin may become irritated if using the CHG soap.  Contact lenses, hearing aids and dentures may not be worn into surgery.  Do not bring valuables to the hospital. [facility] is not responsible for any missing/lost belongings or valuables.   Use CHG Soap or wipes as directed on instruction sheet.  Notify your doctor if there is any change in your medical condition (cold, fever, infection).  Wear comfortable clothing (specific to your surgery type) to the hospital.  Plan for stool softeners for home use; pain medications have a tendency to cause constipation. You can also help prevent constipation by eating foods high in fiber such as fruits and vegetables and drinking plenty of fluids as your diet allows.  After surgery, you can help prevent lung complications by doing breathing exercises.  Take deep breaths and cough every 1-2 hours. Your doctor may order a device called an  Incentive Spirometer to help you take deep breaths. When coughing or sneezing, hold a pillow firmly against your incision with both hands. This is called "splinting." Doing this helps protect your incision. It also decreases belly discomfort.  If you are being admitted to the hospital overnight, leave your suitcase in the car. After surgery it may be brought to your room.  If you are being discharged the day of  surgery, you will not be allowed to drive home. You will need a responsible adult (18 years or older) to drive you home and stay with you that night.   If you are taking public transportation, you will need to have a responsible adult (18 years or older) with you. Please confirm with your physician that it is acceptable to use public transportation.   Please call the [place] at [phone] if you have any questions about these instructions.  Visitation Policy:  Patients undergoing a surgery or procedure may have one family member or support person with them as long as that person is not COVID-19 positive or experiencing its symptoms.  That person may remain in the waiting area during the procedure.  Inpatient Visitation Update:   In an effort to ensure the safety of our team members and our patients, we are implementing a change to our visitation policy:  Effective Monday, Aug. 9, at 7 a.m., inpatients will be allowed one support person.  o The support person may change daily.  o The support person must pass our screening, gel in and out, and wear a mask at all times, including in the patient's room.  o Patients must also wear a mask when staff or their support person are in the room.  o Masking is required regardless of vaccination status.  Systemwide, no visitors 17 or younger.

## 2020-11-30 NOTE — H&P (View-Only) (Signed)
Subjective:   CC: Ventral hernia without obstruction or gangrene [K43.9]  HPI: Alec Hughes is a 54 y.o. male who was referred by Self for evaluation of above. Symptoms were first noted 3 months ago. Pain is sharp and intermittent, confined to the lump, without radiation. Associated with nothing specific, exacerbated by nothing specific. Lump is reducible.   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  Past Surgical History:  Past Surgical History:  Procedure Laterality Date  . Broken vertebrae, questionable broken neck repair 1980  . CIRCUMCISION NEWBORN  . COLONOSCOPY 09/08/2015  Diverticulosis/Negative biopsy/Repeat 53yr/PYO  . EGD 09/08/2015  Reflux Esophagitis/GERD/No Repeat/PYO  . FASCIECTOMY PLANTAR FASCIA Right 11/05/2019  by Dr. TElvina Mattes . HERNIA REPAIR  . Melanoma removal under right ear March 2005  . [place]  [place] [facility] . [place] [facility] . REPAIR VENTRAL HERNIA LAPAROSCOPIC 10/02/2019  robotic assisted IPOM by Dr. SLysle Pearl . SPINE SURGERY  . VASECTOMY 09/2017   Family History: family history includes Cancer in his paternal aunt; Diabetes in his paternal grandmother; Diabetes type II in his maternal grandmother; Liver cancer in his maternal grandfather; Lung cancer in his paternal grandfather; No Known Problems in his daughter, father, and son; Other in his mother.  Social History: reports that he quit smoking about 27 years ago. His smoking use included cigarettes. He has a 9.00 pack-year smoking history. He has never used smokeless tobacco. He reports current alcohol use of about 6.0 standard drinks of alcohol per week. He reports that he does not use drugs.  Current Medications: has a  current medication list which includes the following prescription(s): ibuprofen, methylcellulose, omega 3-dha-epa-fish oil, omeprazole, and sildenafil.  Allergies:  Allergies as of 10/10/2020  . (No Known Allergies)   ROS:  A 15 point review of systems was performed and pertinent positives and negatives noted in HPI  Objective:    BP 110/76  Pulse 58  Ht 182.9 cm (6')  Wt (!) 109.3 kg (241 lb)  BMI 32.69 kg/m   Constitutional : alert, appears stated age, cooperative and no distress  Lymphatics/Throat: no asymmetry, masses, or scars  Respiratory: clear to auscultation bilaterally  Cardiovascular: regular rate and rhythm  Gastrointestinal: soft, non-tender; bowel sounds normal; no masses, no organomegaly. ventral hernia noted. large, reducible, no overlying skin changes and slightly tender  Musculoskeletal: Steady gait and movement  Skin: Cool and moist  Psychiatric: Normal affect, non-agitated, not confused    LABS:  n/a   RADS: n/a Assessment:    Ventral hernia without obstruction or gangrene [K43.9], recurrent  Plan:    1. Ventral hernia without obstruction or gangrene [K43.9]  Will proceed with CT to further assess what is happening with old mesh for pre-operative planning purposes. Pt wishes to have this addressed before end of year since he has met his deductible   Discussed the risk of surgery including recurrence, which can be up to 50% in the case of incisional or complex hernias, possible use of prosthetic materials (mesh) and the increased risk of mesh infxn if used, bleeding, chronic pain, post-op infxn, post-op SBO or ileus, and possible re-operation to address said risks. The risks of general anesthetic, if used, includes MI, CVA, sudden death or even reaction to anesthetic medications also discussed. Alternatives include  continued observation.  Benefits include possible symptom relief, prevention of incarceration, strangulation, enlargement in size over  time, and the risk of emergency surgery in the face of strangulation.  Typical post-op recovery time of 3-5 days with 4-6 weeks of activity restrictions were also discussed.  The patient verbalized understanding and all questions were answered to the patient's satisfaction.

## 2020-11-30 NOTE — H&P (Signed)
Subjective:   CC: Ventral hernia without obstruction or gangrene [K43.9]  HPI: Alec Hughes is a 54 y.o. male who was referred by Self for evaluation of above. Symptoms were first noted 3 months ago. Pain is sharp and intermittent, confined to the lump, without radiation. Associated with nothing specific, exacerbated by nothing specific. Lump is reducible.   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  Past Surgical History:  Past Surgical History:  Procedure Laterality Date  . Broken vertebrae, questionable broken neck repair 1980  . CIRCUMCISION NEWBORN  . COLONOSCOPY 09/08/2015  Diverticulosis/Negative biopsy/Repeat 53yr/PYO  . EGD 09/08/2015  Reflux Esophagitis/GERD/No Repeat/PYO  . FASCIECTOMY PLANTAR FASCIA Right 11/05/2019  by Dr. TElvina Mattes . HERNIA REPAIR  . Melanoma removal under right ear March 2005  . [place] left wrist 1995  [place] [facility] . [place] [facility] . REPAIR VENTRAL HERNIA LAPAROSCOPIC 10/02/2019  robotic assisted IPOM by Dr. SLysle Pearl . SPINE SURGERY  . VASECTOMY 09/2017   Family History: family history includes Cancer in his paternal aunt; Diabetes in his paternal grandmother; Diabetes type II in his maternal grandmother; Liver cancer in his maternal grandfather; Lung cancer in his paternal grandfather; No Known Problems in his daughter, father, and son; Other in his mother.  Social History: reports that he quit smoking about 27 years ago. His smoking use included cigarettes. He has a 9.00 pack-year smoking history. He has never used smokeless tobacco. He reports current alcohol use of about 6.0 standard drinks of alcohol per week. He reports that he does not use drugs.  Current Medications: has a  current medication list which includes the following prescription(s): ibuprofen, methylcellulose, omega 3-dha-epa-fish oil, omeprazole, and sildenafil.  Allergies:  Allergies as of 10/10/2020  . (No Known Allergies)   ROS:  A 15 point review of systems was performed and pertinent positives and negatives noted in HPI  Objective:    BP 110/76  Pulse 58  Ht 182.9 cm (6')  Wt (!) 109.3 kg (241 lb)  BMI 32.69 kg/m   Constitutional : alert, appears stated age, cooperative and no distress  Lymphatics/Throat: no asymmetry, masses, or scars  Respiratory: clear to auscultation bilaterally  Cardiovascular: regular rate and rhythm  Gastrointestinal: soft, non-tender; bowel sounds normal; no masses, no organomegaly. ventral hernia noted. large, reducible, no overlying skin changes and slightly tender  Musculoskeletal: Steady gait and movement  Skin: Cool and moist  Psychiatric: Normal affect, non-agitated, not confused    LABS:  n/a   RADS: n/a Assessment:    Ventral hernia without obstruction or gangrene [K43.9], recurrent  Plan:    1. Ventral hernia without obstruction or gangrene [K43.9]  Will proceed with CT to further assess what is happening with old mesh for pre-operative planning purposes. Pt wishes to have this addressed before end of year since he has met his deductible   Discussed the risk of surgery including recurrence, which can be up to 50% in the case of incisional or complex hernias, possible use of prosthetic materials (mesh) and the increased risk of mesh infxn if used, bleeding, chronic pain, post-op infxn, post-op SBO or ileus, and possible re-operation to address said risks. The risks of general anesthetic, if used, includes MI, CVA, sudden death or even reaction to anesthetic medications also discussed. Alternatives include  continued observation.  Benefits include possible symptom relief, prevention of incarceration, strangulation, enlargement in size over  time, and the risk of emergency surgery in the face of strangulation.  Typical post-op recovery time of 3-5 days with 4-6 weeks of activity restrictions were also discussed.  The patient verbalized understanding and all questions were answered to the patient's satisfaction.

## 2020-12-06 ENCOUNTER — Other Ambulatory Visit
Admission: RE | Admit: 2020-12-06 | Discharge: 2020-12-06 | Disposition: A | Discharge status: Home / Self Care | Payer: BC Managed Care – PPO | Source: Ambulatory Visit | Attending: Surgery | Admitting: Surgery

## 2020-12-06 ENCOUNTER — Other Ambulatory Visit: Payer: Self-pay

## 2020-12-06 DIAGNOSIS — K432 Incisional hernia without obstruction or gangrene: Secondary | ICD-10-CM | POA: Diagnosis not present

## 2020-12-06 DIAGNOSIS — Z791 Long term (current) use of non-steroidal anti-inflammatories (NSAID): Secondary | ICD-10-CM | POA: Diagnosis not present

## 2020-12-06 DIAGNOSIS — Z01812 Encounter for preprocedural laboratory examination: Secondary | ICD-10-CM | POA: Insufficient documentation

## 2020-12-06 DIAGNOSIS — Z79899 Other long term (current) drug therapy: Secondary | ICD-10-CM | POA: Diagnosis not present

## 2020-12-06 DIAGNOSIS — Z20822 Contact with and (suspected) exposure to covid-19: Secondary | ICD-10-CM | POA: Insufficient documentation

## 2020-12-06 DIAGNOSIS — Z87891 Personal history of nicotine dependence: Secondary | ICD-10-CM | POA: Diagnosis not present

## 2020-12-06 LAB — SARS CORONAVIRUS 2 (TAT 6-24 HRS): SARS Coronavirus 2: NEGATIVE

## 2020-12-08 ENCOUNTER — Ambulatory Visit: Payer: BC Managed Care – PPO | Admitting: Anesthesiology

## 2020-12-08 ENCOUNTER — Encounter
Admission: RE | Disposition: A | Discharge status: Home Health | Payer: Self-pay | Source: Home / Self Care | Attending: Surgery

## 2020-12-08 ENCOUNTER — Other Ambulatory Visit: Payer: Self-pay

## 2020-12-08 ENCOUNTER — Encounter: Payer: Self-pay | Admitting: Surgery

## 2020-12-08 ENCOUNTER — Ambulatory Visit
Admission: RE | Admit: 2020-12-08 | Discharge: 2020-12-08 | Disposition: A | Discharge status: Home Health | Payer: BC Managed Care – PPO | Attending: Surgery | Admitting: Surgery

## 2020-12-08 DIAGNOSIS — K432 Incisional hernia without obstruction or gangrene: Secondary | ICD-10-CM | POA: Diagnosis not present

## 2020-12-08 DIAGNOSIS — Z79899 Other long term (current) drug therapy: Secondary | ICD-10-CM | POA: Insufficient documentation

## 2020-12-08 DIAGNOSIS — Z791 Long term (current) use of non-steroidal anti-inflammatories (NSAID): Secondary | ICD-10-CM | POA: Insufficient documentation

## 2020-12-08 DIAGNOSIS — Z20822 Contact with and (suspected) exposure to covid-19: Secondary | ICD-10-CM | POA: Insufficient documentation

## 2020-12-08 DIAGNOSIS — K439 Ventral hernia without obstruction or gangrene: Secondary | ICD-10-CM

## 2020-12-08 DIAGNOSIS — Z87891 Personal history of nicotine dependence: Secondary | ICD-10-CM | POA: Insufficient documentation

## 2020-12-08 HISTORY — PX: VENTRAL HERNIA REPAIR: SHX424

## 2020-12-08 SURGERY — REPAIR, HERNIA, VENTRAL
Anesthesia: General | Site: Abdomen

## 2020-12-08 MED ORDER — IBUPROFEN 800 MG PO TABS: 800.0000 mg | ORAL_TABLET | Freq: Three times a day (TID) | ORAL | 0 refills | Status: DC | PRN

## 2020-12-08 MED ORDER — ROCURONIUM BROMIDE 10 MG/ML (PF) SYRINGE
PREFILLED_SYRINGE | INTRAVENOUS | Status: AC
  Filled 2020-12-08: qty 10

## 2020-12-08 MED ORDER — PROPOFOL 10 MG/ML IV BOLUS
INTRAVENOUS | Status: AC
  Filled 2020-12-08: qty 20

## 2020-12-08 MED ORDER — FENTANYL CITRATE (PF) 100 MCG/2ML IJ SOLN
INTRAMUSCULAR | Status: AC
  Filled 2020-12-08: qty 2

## 2020-12-08 MED ORDER — GABAPENTIN 300 MG PO CAPS
ORAL_CAPSULE | ORAL | Status: AC
  Administered 2020-12-08: 08:00:00 300 mg via ORAL
  Filled 2020-12-08: qty 1

## 2020-12-08 MED ORDER — DOCUSATE SODIUM 100 MG PO CAPS: 100.0000 mg | ORAL_CAPSULE | Freq: Two times a day (BID) | ORAL | 0 refills | Status: AC | PRN

## 2020-12-08 MED ORDER — BUPIVACAINE HCL (PF) 0.5 % IJ SOLN
INTRAMUSCULAR | Status: AC
  Filled 2020-12-08: qty 30

## 2020-12-08 MED ORDER — CEFAZOLIN SODIUM-DEXTROSE 2-4 GM/100ML-% IV SOLN
2.0000 g | INTRAVENOUS | Status: AC
  Administered 2020-12-08: 2 g via INTRAVENOUS

## 2020-12-08 MED ORDER — EPHEDRINE SULFATE 50 MG/ML IJ SOLN
INTRAMUSCULAR | Status: DC | PRN
  Administered 2020-12-08: 10 mg via INTRAVENOUS

## 2020-12-08 MED ORDER — GABAPENTIN 300 MG PO CAPS: 300.0000 mg | ORAL_CAPSULE | ORAL | Status: AC

## 2020-12-08 MED ORDER — ROCURONIUM BROMIDE 100 MG/10ML IV SOLN
INTRAVENOUS | Status: DC | PRN
  Administered 2020-12-08: 30 mg via INTRAVENOUS
  Administered 2020-12-08 (×2): 20 mg via INTRAVENOUS
  Administered 2020-12-08: 30 mg via INTRAVENOUS
  Administered 2020-12-08: 60 mg via INTRAVENOUS
  Administered 2020-12-08: 40 mg via INTRAVENOUS

## 2020-12-08 MED ORDER — ACETAMINOPHEN 500 MG PO TABS
ORAL_TABLET | ORAL | Status: AC
  Administered 2020-12-08: 08:00:00 1000 mg via ORAL
  Filled 2020-12-08: qty 2

## 2020-12-08 MED ORDER — OXYCODONE HCL 5 MG PO TABS
5.0000 mg | ORAL_TABLET | Freq: Once | ORAL | Status: AC | PRN
  Administered 2020-12-08: 5 mg via ORAL

## 2020-12-08 MED ORDER — ORAL CARE MOUTH RINSE: 15.0000 mL | Freq: Once | OROMUCOSAL | Status: AC

## 2020-12-08 MED ORDER — PROPOFOL 10 MG/ML IV BOLUS
INTRAVENOUS | Status: DC | PRN
  Administered 2020-12-08: 150 mg via INTRAVENOUS

## 2020-12-08 MED ORDER — MIDAZOLAM HCL 2 MG/2ML IJ SOLN
INTRAMUSCULAR | Status: AC
  Filled 2020-12-08: qty 2

## 2020-12-08 MED ORDER — CEFAZOLIN SODIUM-DEXTROSE 2-4 GM/100ML-% IV SOLN
INTRAVENOUS | Status: AC
  Filled 2020-12-08: qty 100

## 2020-12-08 MED ORDER — ONDANSETRON HCL 4 MG/2ML IJ SOLN
INTRAMUSCULAR | Status: AC
  Filled 2020-12-08: qty 2

## 2020-12-08 MED ORDER — BUPIVACAINE LIPOSOME 1.3 % IJ SUSP
INTRAMUSCULAR | Status: AC
  Filled 2020-12-08: qty 20

## 2020-12-08 MED ORDER — ONDANSETRON HCL 4 MG/2ML IJ SOLN
4.0000 mg | Freq: Once | INTRAMUSCULAR | Status: AC | PRN
  Administered 2020-12-08: 12:00:00 4 mg via INTRAVENOUS

## 2020-12-08 MED ORDER — CELECOXIB 200 MG PO CAPS: 200.0000 mg | ORAL_CAPSULE | ORAL | Status: AC

## 2020-12-08 MED ORDER — EPHEDRINE 5 MG/ML INJ
INTRAVENOUS | Status: AC
  Filled 2020-12-08: qty 10

## 2020-12-08 MED ORDER — OXYCODONE HCL 5 MG PO TABS
ORAL_TABLET | ORAL | Status: AC
  Filled 2020-12-08: qty 1

## 2020-12-08 MED ORDER — LACTATED RINGERS IV SOLN: INTRAVENOUS | Status: DC

## 2020-12-08 MED ORDER — LIDOCAINE HCL 4 % MT SOLN
OROMUCOSAL | Status: DC | PRN
  Administered 2020-12-08: 4 mL via TOPICAL

## 2020-12-08 MED ORDER — CHLORHEXIDINE GLUCONATE 0.12 % MT SOLN
OROMUCOSAL | Status: AC
  Administered 2020-12-08: 08:00:00 15 mL via OROMUCOSAL
  Filled 2020-12-08: qty 15

## 2020-12-08 MED ORDER — ACETAMINOPHEN 500 MG PO TABS: 1000.0000 mg | ORAL_TABLET | ORAL | Status: AC

## 2020-12-08 MED ORDER — MIDAZOLAM HCL 2 MG/2ML IJ SOLN
INTRAMUSCULAR | Status: DC | PRN
  Administered 2020-12-08: 2 mg via INTRAVENOUS

## 2020-12-08 MED ORDER — OXYCODONE HCL 5 MG/5ML PO SOLN: 5.0000 mg | Freq: Once | ORAL | Status: AC | PRN

## 2020-12-08 MED ORDER — BUPIVACAINE HCL 0.5 % IJ SOLN
INTRAMUSCULAR | Status: DC | PRN
  Administered 2020-12-08: 30 mL

## 2020-12-08 MED ORDER — FENTANYL CITRATE (PF) 100 MCG/2ML IJ SOLN
25.0000 ug | INTRAMUSCULAR | Status: DC | PRN
Start: 2020-12-08 — End: 2020-12-08

## 2020-12-08 MED ORDER — LIDOCAINE HCL (PF) 2 % IJ SOLN
INTRAMUSCULAR | Status: AC
  Filled 2020-12-08: qty 5

## 2020-12-08 MED ORDER — PHENYLEPHRINE HCL (PRESSORS) 10 MG/ML IV SOLN
INTRAVENOUS | Status: DC | PRN
  Administered 2020-12-08 (×2): 100 ug via INTRAVENOUS

## 2020-12-08 MED ORDER — DEXAMETHASONE SODIUM PHOSPHATE 10 MG/ML IJ SOLN
INTRAMUSCULAR | Status: DC | PRN
  Administered 2020-12-08: 10 mg via INTRAVENOUS

## 2020-12-08 MED ORDER — CELECOXIB 200 MG PO CAPS
ORAL_CAPSULE | ORAL | Status: AC
  Administered 2020-12-08: 08:00:00 200 mg via ORAL
  Filled 2020-12-08: qty 1

## 2020-12-08 MED ORDER — LIDOCAINE HCL (CARDIAC) PF 100 MG/5ML IV SOSY
PREFILLED_SYRINGE | INTRAVENOUS | Status: DC | PRN
  Administered 2020-12-08: 100 mg via INTRAVENOUS

## 2020-12-08 MED ORDER — FENTANYL CITRATE (PF) 250 MCG/5ML IJ SOLN
INTRAMUSCULAR | Status: DC | PRN
  Administered 2020-12-08 (×3): 50 ug via INTRAVENOUS

## 2020-12-08 MED ORDER — SUGAMMADEX SODIUM 200 MG/2ML IV SOLN
INTRAVENOUS | Status: DC | PRN
  Administered 2020-12-08: 200 mg via INTRAVENOUS

## 2020-12-08 MED ORDER — BUPIVACAINE LIPOSOME 1.3 % IJ SUSP
INTRAMUSCULAR | Status: DC | PRN
  Administered 2020-12-08: 20 mL

## 2020-12-08 MED ORDER — DEXAMETHASONE SODIUM PHOSPHATE 10 MG/ML IJ SOLN
INTRAMUSCULAR | Status: AC
  Filled 2020-12-08: qty 1

## 2020-12-08 MED ORDER — CHLORHEXIDINE GLUCONATE 0.12 % MT SOLN: 15.0000 mL | Freq: Once | OROMUCOSAL | Status: AC

## 2020-12-08 MED ORDER — ONDANSETRON HCL 4 MG/2ML IJ SOLN
INTRAMUSCULAR | Status: DC | PRN
  Administered 2020-12-08: 4 mg via INTRAVENOUS

## 2020-12-08 MED ORDER — ACETAMINOPHEN 325 MG PO TABS: 650.0000 mg | ORAL_TABLET | Freq: Three times a day (TID) | ORAL | 0 refills | Status: AC | PRN

## 2020-12-08 MED ORDER — HYDROCODONE-ACETAMINOPHEN 5-325 MG PO TABS: 1.0000 | ORAL_TABLET | Freq: Four times a day (QID) | ORAL | 0 refills | Status: DC | PRN

## 2020-12-08 MED ORDER — CHLORHEXIDINE GLUCONATE CLOTH 2 % EX PADS: 6.0000 | MEDICATED_PAD | Freq: Once | CUTANEOUS | Status: DC

## 2020-12-08 SURGICAL SUPPLY — 37 items
ADH SKN CLS APL DERMABOND .7 (GAUZE/BANDAGES/DRESSINGS) ×1
APL PRP STRL LF DISP 70% ISPRP (MISCELLANEOUS) ×1
BLADE SURG 15 STRL LF DISP TIS (BLADE) ×1 IMPLANT
BLADE SURG 15 STRL SS (BLADE) ×3
CANISTER SUCT 1200ML W/VALVE (MISCELLANEOUS) ×3 IMPLANT
CHLORAPREP W/TINT 26 (MISCELLANEOUS) ×3 IMPLANT
COVER WAND RF STERILE (DRAPES) ×3 IMPLANT
DERMABOND ADVANCED (GAUZE/BANDAGES/DRESSINGS) ×2
DERMABOND ADVANCED .7 DNX12 (GAUZE/BANDAGES/DRESSINGS) ×1 IMPLANT
DRAPE LAPAROTOMY 100X77 ABD (DRAPES) ×3 IMPLANT
ELECT CAUTERY BLADE 6.4 (BLADE) ×3 IMPLANT
ELECT REM PT RETURN 9FT ADLT (ELECTROSURGICAL) ×3
ELECTRODE REM PT RTRN 9FT ADLT (ELECTROSURGICAL) ×1 IMPLANT
GLOVE BIOGEL PI IND STRL 7.0 (GLOVE) IMPLANT
GLOVE BIOGEL PI INDICATOR 7.0 (GLOVE)
GLOVE SURG SYN 6.5 ES PF (GLOVE) ×6 IMPLANT
GOWN STRL REUS W/ TWL LRG LVL3 (GOWN DISPOSABLE) ×2 IMPLANT
GOWN STRL REUS W/TWL LRG LVL3 (GOWN DISPOSABLE) ×6
KIT TURNOVER KIT A (KITS) ×3 IMPLANT
LABEL OR SOLS (LABEL) ×3 IMPLANT
MANIFOLD NEPTUNE II (INSTRUMENTS) ×3 IMPLANT
MESH VENTRIO PATCH MED OVAL (Mesh General) ×3 IMPLANT
NEEDLE HYPO 22GX1.5 SAFETY (NEEDLE) ×6 IMPLANT
NS IRRIG 500ML POUR BTL (IV SOLUTION) ×3 IMPLANT
PACK BASIN MINOR ARMC (MISCELLANEOUS) ×3 IMPLANT
SPONGE LAP 18X18 RF (DISPOSABLE) ×3 IMPLANT
SUT ETHIBOND NAB MO 7 #0 18IN (SUTURE) ×3 IMPLANT
SUT MNCRL 4-0 (SUTURE) ×3
SUT MNCRL 4-0 27XMFL (SUTURE) ×1
SUT PDS AB 0 CT1 27 (SUTURE) ×18 IMPLANT
SUT VIC AB 0 CT2 27 (SUTURE) ×3 IMPLANT
SUT VIC AB 3-0 SH 27 (SUTURE) ×6
SUT VIC AB 3-0 SH 27X BRD (SUTURE) ×2 IMPLANT
SUTURE MNCRL 4-0 27XMF (SUTURE) ×1 IMPLANT
SYR 10ML LL (SYRINGE) ×6 IMPLANT
SYR 20ML LL LF (SYRINGE) ×3 IMPLANT
WATER STERILE IRR 1000ML POUR (IV SOLUTION) ×3 IMPLANT

## 2020-12-08 NOTE — Interval H&P Note (Signed)
History and Physical Interval Note:  12/08/2020 7:53 AM  Alec Hughes  has presented today for surgery, with the diagnosis of K43.9 Ventral hernia w/o obstruction or gangrene.  The various methods of treatment have been discussed with the patient and family. After consideration of risks, benefits and other options for treatment, the patient has consented to  Procedure(s): HERNIA REPAIR VENTRAL ADULT (N/A) as a surgical intervention.  The patient's history has been reviewed, patient examined, no change in status, stable for surgery.  I have reviewed the patient's chart and labs.  Questions were answered to the patient's satisfaction.     Abdirahim Flavell Lysle Pearl

## 2020-12-08 NOTE — Anesthesia Postprocedure Evaluation (Signed)
Anesthesia Post Note  Patient: Alec Hughes  Procedure(s) Performed: HERNIA REPAIR VENTRAL ADULT (N/A Abdomen)  Patient location during evaluation: PACU Anesthesia Type: General Level of consciousness: awake and alert Pain management: pain level controlled Vital Signs Assessment: post-procedure vital signs reviewed and stable Respiratory status: spontaneous breathing, nonlabored ventilation, respiratory function stable and patient connected to nasal cannula oxygen Cardiovascular status: blood pressure returned to baseline and stable Postop Assessment: no apparent nausea or vomiting Anesthetic complications: no   No complications documented.   Last Vitals:  Vitals:   12/08/20 1257 12/08/20 1311  BP:  126/81  Pulse: 88 79  Resp: 19 18  Temp: (!) 36.2 C (!) 36.1 C  SpO2: 97% 95%    Last Pain:  Vitals:   12/08/20 1311  TempSrc: Temporal  PainSc: 3                  Arita Miss

## 2020-12-08 NOTE — Op Note (Signed)
Preoperative diagnosis: Recurrent ventral hernia, reducible Postoperative diagnosis: same  Procedure:  Open recurrent ventral hernia repair with mesh anesthesia: LMA  Surgeon: Benjamine Sprague  Wound Classification: Clean  Specimen: none  Complications: None  Estimated Blood Loss: minimal  Indications:see HPI  Findings: 1. 4-1/2 cm x 6 cm ventral hernia 2. Tension free repair achieved with suture 3. Adequate hemostasis  Description of procedure: The patient was brought to the operating room and general anesthesia was induced. A time-out was completed verifying correct patient, procedure, site, positioning, and implant(s) and/or special equipment prior to beginning this procedure. Antibiotics were administered prior to making the incision. SCDs placed. The anterior abdominal wall was prepped and draped in the standard sterile fashion.   An  incision was made overlying the palpable hernia defect in the subxiphoid area.  Dissection carried down to fascia where the hernia sac and effect was noted.  The hernia sac did not contain any palpable contents.  Sac was dissected off the surrounding tissue reduced back deep to the fascia.  During this dissection, a plane was created within the preperitoneal layer and retrorectus by making a small incision in the posterior rectus sheath at the point where the rectus muscles were palpable.  Once enough of the posterior rectus sheath with released and the hernia contents were separated from the posterior sheath, the peritoneal defect was primarily closed with 0 Vicryl, care to know not involve any bowel during the closure.  Umbilical hernia was noted during this portion and this was primary closed with additional 0 PDS.  The posterior rectus sheath was then approximated using multiple 0 PDS in an interrupted fashion under minimal tension as possible.    Posterior sheath noted to be extremely thinned out especially at the midline where he is noted to have a known  diastases.  However, at the end of the approximation, sutures were intact and all hernia contents were deep to the posterior fascial repair.  15 x 11 cm Bard mesh was then placed atop the posterior rectus sheath, secured to it using interrupted 0 Ethibond sutures in the cardinal points, and then the anterior rectus sheath was closed above it using 0 PDS in a running fashion x2.  The superficial portion of the mesh was partially included to the closure of the anterior fascia in order to further secure it in place.  Anterior fascia also was extremely thinned out but remained intact and secured at the end of approximation.  Wound was then irrigated, Exparel infused, and the skin was closed in multiple layers of 3-0 Vicryl in an interrupted fashion for the subcutaneous and deep dermal layer, running 4-0 Monocryl for the skin.  Wound was then dressed with Dermabond.  Patient was then successfully awakened and transferred to PACU in stable condition.  At the end of the procedure sponge and instrument counts were correct

## 2020-12-08 NOTE — Anesthesia Procedure Notes (Signed)
Procedure Name: Intubation Date/Time: 12/08/2020 8:34 AM Performed by: Eben Burow, CRNA Pre-anesthesia Checklist: Patient identified, Emergency Drugs available, Suction available and Patient being monitored Patient Re-evaluated:Patient Re-evaluated prior to induction Oxygen Delivery Method: Circle system utilized Preoxygenation: Pre-oxygenation with 100% oxygen Induction Type: IV induction Ventilation: Mask ventilation without difficulty Laryngoscope Size: Miller and 2 Grade View: Grade I Tube type: Oral Tube size: 7.5 mm Number of attempts: 1 Airway Equipment and Method: Stylet and LTA kit utilized Placement Confirmation: ETT inserted through vocal cords under direct vision,  positive ETCO2 and breath sounds checked- equal and bilateral Secured at: 21 cm Tube secured with: Tape Dental Injury: Teeth and Oropharynx as per pre-operative assessment

## 2020-12-08 NOTE — Anesthesia Preprocedure Evaluation (Signed)
Anesthesia Evaluation  Patient identified by MRN, date of birth, ID band Patient awake    Reviewed: Allergy & Precautions, H&P , NPO status , Patient's Chart, lab work & pertinent test results  History of Anesthesia Complications Negative for: history of anesthetic complications  Airway Mallampati: I  TM Distance: >3 FB Neck ROM: full    Dental no notable dental hx. (+) Teeth Intact   Pulmonary neg pulmonary ROS, neg sleep apnea, neg COPD, Patient abstained from smoking.Not current smoker, former smoker,    Pulmonary exam normal breath sounds clear to auscultation       Cardiovascular Exercise Tolerance: Good METS(-) hypertension(-) CAD and (-) Past MI negative cardio ROS Normal cardiovascular exam(-) dysrhythmias  Rhythm:regular Rate:Normal     Neuro/Psych PSYCHIATRIC DISORDERS Depression negative neurological ROS     GI/Hepatic GERD  Medicated and Controlled,(+)     (-) substance abuse  ,   Endo/Other  neg diabetes  Renal/GU negative Renal ROS     Musculoskeletal   Abdominal   Peds  Hematology   Anesthesia Other Findings Past Medical History: No date: Attention deficit No date: Cancer [facility])     Comment:  malignant melanoma of skin No date: Depression No date: Erectile dysfunction of organic origin No date: GERD (gastroesophageal reflux disease) No date: History of hematuria No date: Plantar fasciitis  Reproductive/Obstetrics                             Anesthesia Physical  Anesthesia Plan  ASA: II  Anesthesia Plan: General   Post-op Pain Management:    Induction: Intravenous  PONV Risk Score and Plan: 3 and Ondansetron, Dexamethasone, Treatment may vary due to age or medical condition and Midazolam  Airway Management Planned: Oral ETT  Additional Equipment: None  Intra-op Plan:   Post-operative Plan: Extubation in OR  Informed Consent: I have reviewed the  patients History and Physical, chart, labs and discussed the procedure including the risks, benefits and alternatives for the proposed anesthesia with the patient or authorized representative who has indicated his/her understanding and acceptance.     Dental advisory given  Plan Discussed with: CRNA  Anesthesia Plan Comments: (Discussed risks of anesthesia with patient, including PONV, sore throat, lip/dental damage. Rare risks discussed as well, such as cardiorespiratory and neurological sequelae. Patient understands.)        Anesthesia Quick Evaluation

## 2020-12-08 NOTE — Discharge Instructions (Addendum)
Hernia repair, Care After This sheet gives you information about how to care for yourself after your procedure. Your health care provider may also give you more specific instructions. If you have problems or questions, contact your health care provider. What can I expect after the procedure? After your procedure, it is common to have the following:  Pain in your abdomen, especially in the incision areas. You will be given medicine to control the pain.  Tiredness. This is a normal part of the recovery process. Your energy level will return to normal over the next several weeks.  Changes in your bowel movements, such as constipation or needing to go more often. Talk with your health care provider about how to manage this. Follow these instructions at home: Medicines   tylenol and advil as needed for discomfort.  Please alternate between the two every four hours as needed for pain.     Use narcotics, if prescribed, only when tylenol and motrin is not enough to control pain.   325-650mg every 8hrs to max of 3000mg/24hrs (including the 325mg in every norco dose) for the tylenol.     Advil up to 800mg per dose every 8hrs as needed for pain.    PLEASE RECORD NUMBER OF PILLS TAKEN UNTIL NEXT FOLLOW UP APPT.  THIS WILL HELP DETERMINE HOW READY YOU ARE TO BE RELEASED FROM ANY ACTIVITY RESTRICTIONS  Do not drive or use heavy machinery while taking prescription pain medicine.  Do not drink alcohol while taking prescription pain medicine.  Incision care     Follow instructions from your health care provider about how to take care of your incision areas. Make sure you: ? Keep your incisions clean and dry. ? Wash your hands with soap and water before and after applying medicine to the areas, and before and after changing your bandage (dressing). If soap and water are not available, use hand sanitizer. ? Change your dressing as told by your health care provider. ? Leave stitches (sutures), skin  glue, or adhesive strips in place. These skin closures may need to stay in place for 2 weeks or longer. If adhesive strip edges start to loosen and curl up, you may trim the loose edges. Do not remove adhesive strips completely unless your health care provider tells you to do that.  Do not wear tight clothing over the incisions. Tight clothing may rub and irritate the incision areas, which may cause the incisions to open.  Do not take baths, swim, or use a hot tub until your health care provider approves. OK TO SHOWER IN 24HRS.    Check your incision area every day for signs of infection. Check for: ? More redness, swelling, or pain. ? More fluid or blood. ? Warmth. ? Pus or a bad smell. Activity  Avoid lifting anything that is heavier than 10 lb (4.5 kg) for 2 weeks or until your health care provider says it is okay.  No pushing/pulling greater than 30lbs  You may resume normal activities as told by your health care provider. Ask your health care provider what activities are safe for you.  Take rest breaks during the day as needed. Eating and drinking  Follow instructions from your health care provider about what you can eat after surgery.  To prevent or treat constipation while you are taking prescription pain medicine, your health care provider may recommend that you: ? Drink enough fluid to keep your urine clear or pale yellow. ? Take over-the-counter or prescription medicines. ?   Eat foods that are high in fiber, such as fresh fruits and vegetables, whole grains, and beans. ? Limit foods that are high in fat and processed sugars, such as fried and sweet foods. General instructions  Ask your health care provider when you will need an appointment to get your sutures or staples removed.  Keep all follow-up visits as told by your health care provider. This is important. Contact a health care provider if:  You have more redness, swelling, or pain around your incisions.  You have  more fluid or blood coming from the incisions.  Your incisions feel warm to the touch.  You have pus or a bad smell coming from your incisions or your dressing.  You have a fever.  You have an incision that breaks open (edges not staying together) after sutures or staples have been removed. Get help right away if:  You develop a rash.  You have chest pain or difficulty breathing.  You have pain or swelling in your legs.  You feel light-headed or you faint.  Your abdomen swells (becomes distended).  You have nausea or vomiting.  You have blood in your stool (feces). This information is not intended to replace advice given to you by your health care provider. Make sure you discuss any questions you have with your health care provider. Document Released: 06/29/2005 Document Revised: 08/29/2018 Document Reviewed: 09/10/2016 Elsevier Interactive Patient Education  2019 [place]   1) The drugs that you were given will stay in your system until tomorrow so for the next 24 hours you should not:  A) Drive an automobile B) Make any legal decisions C) Drink any alcoholic beverage   2) You may resume regular meals tomorrow.  Today it is better to start with liquids and gradually work up to solid foods.  You may eat anything you prefer, but it is better to start with liquids, then soup and crackers, and gradually work up to solid foods.   3) Please notify your doctor immediately if you have any unusual bleeding, trouble breathing, redness and pain at the surgery site, drainage, fever, or pain not relieved by medication.  4) Your post-operative visit with Dr.                                     is: Date:                        Time:    Please call to schedule your post-operative visit.  5) Additional Instructions:  Bupivacaine Liposomal Suspension for Injection What is this medicine? BUPIVACAINE LIPOSOMAL (bue PIV a kane  LIP oh som al) is an anesthetic. It causes loss of feeling in the skin or other tissues. It is used to prevent and to treat pain from some procedures. This medicine may be used for other purposes; ask your health care provider or pharmacist if you have questions. COMMON BRAND NAME(S): EXPAREL What should I tell my health care provider before I take this medicine? They need to know if you have any of these conditions:  G6PD deficiency  heart disease  kidney disease  liver disease  low blood pressure  lung or breathing disease, like asthma  an unusual or allergic reaction to bupivacaine, other medicines, foods, dyes, or preservatives  pregnant or trying to get pregnant  breast-feeding How  should I use this medicine? This medicine is for injection into the affected area. It is given by a health care professional in a hospital or clinic setting. Talk to your pediatrician regarding the use of this medicine in children. Special care may be needed. Overdosage: If you think you have taken too much of this medicine contact a poison control center or emergency room at once. NOTE: This medicine is only for you. Do not share this medicine with others. What if I miss a dose? This does not apply. What may interact with this medicine? This medicine may interact with the following medications:  acetaminophen  certain antibiotics like dapsone, nitrofurantoin, aminosalicylic acid, sulfonamides  certain medicines for seizures like phenobarbital, phenytoin, valproic acid  chloroquine  cyclophosphamide  flutamide  hydroxyurea  ifosfamide  metoclopramide  nitric oxide  nitroglycerin  nitroprusside  nitrous oxide  other local anesthetics like lidocaine, pramoxine, tetracaine  primaquine  quinine  rasburicase  sulfasalazine This list may not describe all possible interactions. Give your health care provider a list of all the medicines, herbs, non-prescription drugs, or  dietary supplements you use. Also tell them if you smoke, drink alcohol, or use illegal drugs. Some items may interact with your medicine. What should I watch for while using this medicine? Your condition will be monitored carefully while you are receiving this medicine. Be careful to avoid injury while the area is numb, and you are not aware of pain. What side effects may I notice from receiving this medicine? Side effects that you should report to your doctor or health care professional as soon as possible:  allergic reactions like skin rash, itching or hives, swelling of the face, lips, or tongue  seizures  signs and symptoms of a dangerous change in heartbeat or heart rhythm like chest pain; dizziness; fast, irregular heartbeat; palpitations; feeling faint or lightheaded; falls; breathing problems  signs and symptoms of methemoglobinemia such as pale, gray, or blue colored skin; headache; fast heartbeat; shortness of breath; feeling faint or lightheaded, falls; tiredness Side effects that usually do not require medical attention (report to your doctor or health care professional if they continue or are bothersome):  anxious  back pain  changes in taste  changes in vision  constipation  dizziness  fever  nausea, vomiting This list may not describe all possible side effects. Call your doctor for medical advice about side effects. You may report side effects to FDA at 1-800-FDA-1088. Where should I keep my medicine? This drug is given in a hospital or clinic and will not be stored at home. NOTE: This sheet is a summary. It may not cover all possible information. If you have questions about this medicine, talk to your doctor, pharmacist, or health care provider.  2020 Elsevier/Gold Standard (2019-09-22 10:48:23)

## 2020-12-08 NOTE — Transfer of Care (Signed)
Immediate Anesthesia Transfer of Care Note  Patient: Alec Hughes  Procedure(s) Performed: HERNIA REPAIR VENTRAL ADULT (N/A Abdomen)  Patient Location: PACU  Anesthesia Type:General  Level of Consciousness: drowsy  Airway & Oxygen Therapy: Patient Spontanous Breathing and Patient connected to face mask oxygen  Post-op Assessment: Report given to RN and Post -op Vital signs reviewed and stable  Post vital signs: Reviewed and stable  Last Vitals:  Vitals Value Taken Time  BP 110/59 12/08/20 1134  Temp    Pulse 66 12/08/20 1138  Resp 17 12/08/20 1138  SpO2 97 % 12/08/20 1138  Vitals shown include unvalidated device data.  Last Pain:  Vitals:   12/08/20 0728  TempSrc: Tympanic  PainSc: 0-No pain         Complications: No complications documented.

## 2022-01-09 ENCOUNTER — Other Ambulatory Visit: Payer: Self-pay | Admitting: Physician Assistant

## 2022-01-09 DIAGNOSIS — M2391 Unspecified internal derangement of right knee: Secondary | ICD-10-CM

## 2022-01-09 DIAGNOSIS — M25561 Pain in right knee: Secondary | ICD-10-CM

## 2022-01-17 ENCOUNTER — Ambulatory Visit
Admission: RE | Admit: 2022-01-17 | Discharge: 2022-01-17 | Disposition: A | Discharge status: Home / Self Care | Payer: BC Managed Care – PPO | Source: Ambulatory Visit | Attending: Physician Assistant | Admitting: Physician Assistant

## 2022-01-17 DIAGNOSIS — M25561 Pain in right knee: Secondary | ICD-10-CM | POA: Diagnosis present

## 2022-01-17 DIAGNOSIS — M2391 Unspecified internal derangement of right knee: Secondary | ICD-10-CM | POA: Diagnosis present

## 2022-02-06 ENCOUNTER — Encounter: Payer: Self-pay | Admitting: Family Medicine

## 2022-02-06 ENCOUNTER — Ambulatory Visit: Payer: BC Managed Care – PPO | Admitting: Family Medicine

## 2022-02-06 ENCOUNTER — Other Ambulatory Visit: Payer: Self-pay | Admitting: Family Medicine

## 2022-02-06 ENCOUNTER — Other Ambulatory Visit: Payer: Self-pay

## 2022-02-06 VITALS — BP 108/75 | HR 67 | Ht 72.0 in | Wt 244.2 lb

## 2022-02-06 DIAGNOSIS — G8929 Other chronic pain: Secondary | ICD-10-CM

## 2022-02-06 DIAGNOSIS — Z131 Encounter for screening for diabetes mellitus: Secondary | ICD-10-CM

## 2022-02-06 DIAGNOSIS — K219 Gastro-esophageal reflux disease without esophagitis: Secondary | ICD-10-CM

## 2022-02-06 DIAGNOSIS — Z7689 Persons encountering health services in other specified circumstances: Secondary | ICD-10-CM | POA: Diagnosis not present

## 2022-02-06 DIAGNOSIS — Z13220 Encounter for screening for lipoid disorders: Secondary | ICD-10-CM

## 2022-02-06 DIAGNOSIS — Z0000 Encounter for general adult medical examination without abnormal findings: Secondary | ICD-10-CM

## 2022-02-06 DIAGNOSIS — E538 Deficiency of other specified B group vitamins: Secondary | ICD-10-CM

## 2022-02-06 DIAGNOSIS — M25561 Pain in right knee: Secondary | ICD-10-CM

## 2022-02-06 DIAGNOSIS — Z125 Encounter for screening for malignant neoplasm of prostate: Secondary | ICD-10-CM

## 2022-02-06 MED ORDER — OMEPRAZOLE 20 MG PO CPDR: 20.0000 mg | DELAYED_RELEASE_CAPSULE | Freq: Every day | ORAL | 3 refills | Status: DC

## 2022-02-06 NOTE — Progress Notes (Signed)
Subjective:    Patient ID: Alec Hughes, male    DOB: 30-May-1966, 56 y.o.   MRN: [id number]  Alec Hughes is a 56 y.o. male presenting on 02/06/2022 for Establish Care  Previously w [facility] Dr Raechel Ache since 2015  HPI  GI, Urology, Podiatry, General Surgery, Orthopedic  GERD Chronic problem He takes Omeprazole 20mg  - needs new order, works well. Previously with GI in past, he had Endoscopy (stretched esophagus as well he had some food getting stuck) and Colonoscopy 2016. Fam history polyps he did earlier than age 48. Next due in 2026.  Right Knee, meniscus tear R Knee Pain Chronic osteoarthritis bilateral Has had chronic episodic flare R Knee, now he had worse swelling and pain in past few months. He saw [facility] and they did MRI, see results below, he has felt better and has not returned to them, they never called to schedule f/u. He prefers to avoid procedure if he can. He plays basketball now he is holding off on this, and doing better, less pain  History of Alcohol intake in past. Now reduced.  He has good blood pressure overall.  Melanoma history Managed by Dr [facility]  Health Maintenance:  Shingrix vaccine when ready, he will return for nurse visit.   Depression screen [facility] 2/9 02/06/2022  Decreased Interest 0  Down, Depressed, Hopeless 0  PHQ - 2 Score 0  Altered sleeping 0  Tired, decreased energy 0  Change in appetite 0  Feeling bad or failure about yourself  0  Trouble concentrating 0  Moving slowly or fidgety/restless 0  Suicidal thoughts 0  PHQ-9 Score 0  Difficult doing work/chores Not difficult at all    Past Medical History:  Diagnosis Date   Attention deficit    Cancer ([place])    malignant melanoma of skin   Depression    Erectile dysfunction of organic origin    GERD (gastroesophageal reflux disease)    History of hematuria    Plantar fasciitis    Past Surgical History:  Procedure Laterality Date   broken  vertebrae     COLONOSCOPY WITH PROPOFOL N/A 09/08/2015   Procedure: COLONOSCOPY WITH PROPOFOL;  Surgeon: Hulen Luster, MD;  Location: [facility] ENDOSCOPY;  Service: Gastroenterology;  Laterality: N/A;   ESOPHAGOGASTRODUODENOSCOPY (EGD) WITH PROPOFOL N/A 09/08/2015   Procedure: ESOPHAGOGASTRODUODENOSCOPY (EGD) WITH PROPOFOL;  Surgeon: Hulen Luster, MD;  Location: [facility] ENDOSCOPY;  Service: Gastroenterology;  Laterality: N/A;   FRACTURE SURGERY     MELANOMA EXCISION     orif left wrist     orif right femur     PLANTAR FASCIA RELEASE Right 11/05/2019   Procedure: ENDOSCOPIC PLANTAR FASC. RELEASE RIGHT;  Surgeon: Albertine Patricia, DPM;  Location: [facility];  Service: Podiatry;  Laterality: Right;  LMA WITH POP BLOCK   VENTRAL HERNIA REPAIR N/A 12/08/2020   Procedure: HERNIA REPAIR VENTRAL ADULT;  Surgeon: Benjamine Sprague, DO;  Location: [facility] ORS;  Service: General;  Laterality: N/A;   XI ROBOTIC ASSISTED VENTRAL HERNIA N/A 10/02/2019   Procedure: XI ROBOTIC ASSISTED VENTRAL HERNIA;  Surgeon: Benjamine Sprague, DO;  Location: [facility] ORS;  Service: General;  Laterality: N/A;   Social History   Socioeconomic History   Marital status: Significant Other    Spouse name: Otila Kluver   Number of children: Not on file   Years of education: Not on file   Highest education level: Not on file  Occupational History   Not on file  Tobacco Use  Smoking status: Former    Types: Cigarettes    Quit date: 09/27/1994    Years since quitting: 27.3   Smokeless tobacco: Never  Vaping Use   Vaping Use: Never used  Substance and Sexual Activity   Alcohol use: Yes    Alcohol/week: 14.0 standard drinks    Types: 14 Shots of liquor per week   Drug use: No   Sexual activity: Not on file  Other Topics Concern   Not on file  Social History Narrative   Not on file   Social Determinants of Health   Financial Resource Strain: Not on file  Food Insecurity: Not on file  Transportation Needs: Not on file  Physical Activity: Not  on file  Stress: Not on file  Social Connections: Not on file  Intimate Partner Violence: Not on file   Family History  Problem Relation Age of Onset   Prostate cancer Neg Hx    Bladder Cancer Neg Hx    Kidney cancer Neg Hx    Current Outpatient Medications on File Prior to Visit  Medication Sig   Cyanocobalamin (VITAMIN B-12) 5000 MCG SUBL Place 5,000 mcg under the tongue daily.   ibuprofen (ADVIL) 800 MG tablet Take 1 tablet (800 mg total) by mouth every 8 (eight) hours as needed for mild pain or moderate pain.   omega-3 fish oil (MAXEPA) 1000 MG CAPS capsule Take 1,000 mg by mouth daily.   sildenafil (VIAGRA) 50 MG tablet Take 50 mg by mouth daily as needed for erectile dysfunction.    No current facility-administered medications on file prior to visit.    Review of Systems Per HPI unless specifically indicated above      Objective:    BP 108/75    Pulse 67    Ht 6' (1.829 m)    Wt 244 lb 3.2 oz (110.8 kg)    SpO2 97%    BMI 33.12 kg/m   Wt Readings from Last 3 Encounters:  02/06/22 244 lb 3.2 oz (110.8 kg)  12/08/20 238 lb (108 kg)  11/30/20 240 lb (108.9 kg)    Physical Exam Vitals and nursing note reviewed.  Constitutional:      General: He is not in acute distress.    Appearance: Normal appearance. He is well-developed. He is not diaphoretic.     Comments: Well-appearing, comfortable, cooperative  HENT:     Head: Normocephalic and atraumatic.  Eyes:     General:        Right eye: No discharge.        Left eye: No discharge.     Conjunctiva/sclera: Conjunctivae normal.  Cardiovascular:     Rate and Rhythm: Normal rate.  Pulmonary:     Effort: Pulmonary effort is normal.  Skin:    General: Skin is warm and dry.     Findings: No erythema or rash.  Neurological:     Mental Status: He is alert and oriented to person, place, and time.  Psychiatric:        Mood and Affect: Mood normal.        Behavior: Behavior normal.        Thought Content: Thought  content normal.     Comments: Well groomed, good eye contact, normal speech and thoughts    I have personally reviewed the radiology report from MRI 01/17/22.  MR KNEE RIGHT WO CONTRAST [[id number]] Resulted: 01/17/22 1424  Order Status: Completed Updated: 01/17/22 1426  Narrative:    CLINICAL DATA:  Right  lateral/posterior knee pain for 2 weeks.   EXAM:  MRI OF THE RIGHT KNEE WITHOUT CONTRAST   TECHNIQUE:  Multiplanar, multisequence MR imaging of the knee was performed. No  intravenous contrast was administered.   COMPARISON:  None.   FINDINGS:  MENISCI   Medial meniscus: Mild intrasubstance degeneration with subtle  undersurface irregularity in the region of the posterior horn-body  junction suspicious for developing tear (series 10, images 24-25).   Lateral meniscus: Blunting of the free edge of the lateral meniscal  posterior horn (series 10, images 10-12) with slight extrusion of  the meniscal body.   LIGAMENTS   Cruciates: Intact ACL and PCL.   Collaterals: Intact MCL. Lateral collateral ligament complex intact.   CARTILAGE   Patellofemoral: Mild-moderate chondral thinning and surface  irregularity of the lateral patellar facet.   Medial: Minimal chondral thinning of the weight-bearing medial  compartment.   Lateral:  No chondral defect.   MISCELLANEOUS   Joint: Small joint effusion. Fluid distension of the subpopliteal  recess. Fat pads within normal limits.   Popliteal Fossa:  No Baker's cyst. Intact popliteus tendon.   Extensor Mechanism:  Intact quadriceps and patellar tendons.   Bones: No acute fracture. No dislocation. Mild subchondral marrow  edema within the lateral patella. Partially visualized distal  femoral ORIF hardware with associated susceptibility artifact. No  marrow replacing bone lesion.   Other: No significant periarticular soft tissue findings.   IMPRESSION:  1. Blunting of the free edge of the lateral meniscal posterior horn   with slight extrusion of the meniscal body.  2. Mild intrasubstance degeneration with subtle undersurface  irregularity in the region of the posterior horn-body junction  suspicious for developing tear.  3. Mild-moderate patellofemoral osteoarthritis.  4. Small joint effusion.    Electronically Signed    By: Davina Poke D.O.    On: 01/17/2022 14:24      Results for orders placed or performed in visit on 02/06/22  [facility] HIV SCREENING LAB  Result Value Ref Range   [facility] HIV Screening Negative - Validated       Assessment & Plan:   Problem List Items Addressed This Visit   None Visit Diagnoses     Chronic pain of right knee    -  Primary   Gastroesophageal reflux disease without esophagitis       Relevant Medications   omeprazole (PRILOSEC) 20 MG capsule       Establish care Review outside previous records specalist and PCP  GERD Re order PPI Omeprazole 20mg  daily  Chronic Knee Pain OA/DJD underlying R>L with recent confirmed meniscus injury, likely degenerative early vs partial tear, on MRI reviewed today. Patient should return to [facility] for further management on this issue - may warrant arthroscopic repair in future - He is doing better now with rest and conservative care - He may do OTC options such as knee brace PRN - IF need may try anti inflammatory topical - OTC Voltaren (generic Diclofenac) topical 2-4 times a day as needed for pain swelling of affected joint for 1-2 weeks or longer.  Shingrix vaccine as discussed, can return when ready to pursue    Meds ordered this encounter  Medications   omeprazole (PRILOSEC) 20 MG capsule    Sig: Take 1 capsule (20 mg total) by mouth daily before breakfast.    Dispense:  90 capsule    Refill:  3      Follow up plan: Return in about 3 weeks (around  02/27/2022) for 3-4 weeks fasting lab only then 1 week later Annual Physical.  Future labs CMET, Lipid, CBC, (A1c), B12, PSA, TSH  Nobie Putnam,  DO [facility] 02/06/2022, 10:30 AM

## 2022-02-06 NOTE — Patient Instructions (Addendum)
Thank you for coming to the office today.  Shingrix vaccine 2 doses 2-6 months apart, excellent immunity against long term shingles nerve damage.  Dr Posey Pronto or Regino Bellow, [address] [place], [place] [address]   Ph - [phone]  Likely degenerative meniscus tear as discussed, seems to be structural injury and may need future arthroscopic repair to help.  DUE for FASTING BLOOD WORK (no food or drink after midnight before the lab appointment, only water or coffee without cream/sugar on the morning of)  SCHEDULE "Lab Only" visit in the morning at the clinic for lab draw in 3-4 WEEKS  - Make sure Lab Only appointment is at about 1 week before your next appointment, so that results will be available  For Lab Results, once available within 2-3 days of blood draw, you can can log in to MyChart online to view your results and a brief explanation. Also, we can discuss results at next follow-up visit.   Please schedule a Follow-up Appointment to: Return in about 3 weeks (around 02/27/2022) for 3-4 weeks fasting lab only then 1 week later Annual Physical.  If you have any other questions or concerns, please feel free to call the office or send a message through [facility]. You may also schedule an earlier appointment if necessary.  Additionally, you may be receiving a survey about your experience at our office within a few days to 1 week by e-mail or mail. We value your feedback.  Nobie Putnam, DO [place]

## 2022-02-20 ENCOUNTER — Other Ambulatory Visit: Payer: Self-pay

## 2022-02-20 DIAGNOSIS — Z13220 Encounter for screening for lipoid disorders: Secondary | ICD-10-CM

## 2022-02-20 DIAGNOSIS — Z125 Encounter for screening for malignant neoplasm of prostate: Secondary | ICD-10-CM

## 2022-02-20 DIAGNOSIS — Z131 Encounter for screening for diabetes mellitus: Secondary | ICD-10-CM

## 2022-02-20 DIAGNOSIS — E538 Deficiency of other specified B group vitamins: Secondary | ICD-10-CM

## 2022-02-20 DIAGNOSIS — Z0000 Encounter for general adult medical examination without abnormal findings: Secondary | ICD-10-CM

## 2022-02-21 ENCOUNTER — Other Ambulatory Visit: Payer: Self-pay

## 2022-02-21 ENCOUNTER — Other Ambulatory Visit: Payer: BC Managed Care – PPO

## 2022-02-22 LAB — CBC WITH DIFFERENTIAL/PLATELET
Absolute Monocytes: 455 cells/uL (ref 200–950)
Basophils Absolute: 32 cells/uL (ref 0–200)
Basophils Relative: 0.7 %
Eosinophils Absolute: 243 cells/uL (ref 15–500)
Eosinophils Relative: 5.4 %
HCT: 42.8 % (ref 38.5–50.0)
Hemoglobin: 13.8 g/dL (ref 13.2–17.1)
Lymphs Abs: 1350 cells/uL (ref 850–3900)
MCH: 27.9 pg (ref 27.0–33.0)
MCHC: 32.2 g/dL (ref 32.0–36.0)
MCV: 86.5 fL (ref 80.0–100.0)
MPV: 10.7 fL (ref 7.5–12.5)
Monocytes Relative: 10.1 %
Neutro Abs: 2421 cells/uL (ref 1500–7800)
Neutrophils Relative %: 53.8 %
Platelets: 221 10*3/uL (ref 140–400)
RBC: 4.95 10*6/uL (ref 4.20–5.80)
RDW: 13.1 % (ref 11.0–15.0)
Total Lymphocyte: 30 %
WBC: 4.5 10*3/uL (ref 3.8–10.8)

## 2022-02-22 LAB — COMPLETE METABOLIC PANEL WITH GFR
AG Ratio: 1.8 (calc) (ref 1.0–2.5)
ALT: 35 U/L (ref 9–46)
AST: 31 U/L (ref 10–35)
Albumin: 4.2 g/dL (ref 3.6–5.1)
Alkaline phosphatase (APISO): 63 U/L (ref 35–144)
BUN: 19 mg/dL (ref 7–25)
CO2: 29 mmol/L (ref 20–32)
Calcium: 9.2 mg/dL (ref 8.6–10.3)
Chloride: 105 mmol/L (ref 98–110)
Creat: 1.09 mg/dL (ref 0.70–1.30)
Globulin: 2.4 g/dL (calc) (ref 1.9–3.7)
Glucose, Bld: 91 mg/dL (ref 65–99)
Potassium: 4.5 mmol/L (ref 3.5–5.3)
Sodium: 139 mmol/L (ref 135–146)
Total Bilirubin: 0.5 mg/dL (ref 0.2–1.2)
Total Protein: 6.6 g/dL (ref 6.1–8.1)
eGFR: 81 mL/min/{1.73_m2} (ref >=60)

## 2022-02-22 LAB — HEMOGLOBIN A1C
Hgb A1c MFr Bld: 5.3 % of total Hgb (ref <5.7)
Mean Plasma Glucose: 105 mg/dL
eAG (mmol/L): 5.8 mmol/L

## 2022-02-22 LAB — LIPID PANEL
Cholesterol: 177 mg/dL (ref <200)
HDL: 48 mg/dL (ref >=40)
LDL Cholesterol (Calc): 112 mg/dL (calc) — ABNORMAL HIGH
Non-HDL Cholesterol (Calc): 129 mg/dL (calc) (ref <130)
Total CHOL/HDL Ratio: 3.7 (calc) (ref <5.0)
Triglycerides: 84 mg/dL (ref <150)

## 2022-02-22 LAB — VITAMIN B12: Vitamin B-12: 1043 pg/mL (ref 200–1100)

## 2022-02-22 LAB — TSH: TSH: 3.38 mIU/L (ref 0.40–4.50)

## 2022-02-22 LAB — PSA: PSA: 0.54 ng/mL (ref <=4.00)

## 2022-02-26 ENCOUNTER — Ambulatory Visit (INDEPENDENT_AMBULATORY_CARE_PROVIDER_SITE_OTHER): Payer: BC Managed Care – PPO | Admitting: Family Medicine

## 2022-02-26 ENCOUNTER — Other Ambulatory Visit: Payer: Self-pay

## 2022-02-26 ENCOUNTER — Encounter: Payer: Self-pay | Admitting: Family Medicine

## 2022-02-26 VITALS — BP 118/74 | HR 60 | Ht 72.0 in | Wt 243.4 lb

## 2022-02-26 DIAGNOSIS — Z0000 Encounter for general adult medical examination without abnormal findings: Secondary | ICD-10-CM | POA: Diagnosis not present

## 2022-02-26 DIAGNOSIS — N401 Enlarged prostate with lower urinary tract symptoms: Secondary | ICD-10-CM

## 2022-02-26 DIAGNOSIS — E538 Deficiency of other specified B group vitamins: Secondary | ICD-10-CM

## 2022-02-26 DIAGNOSIS — R3912 Poor urinary stream: Secondary | ICD-10-CM | POA: Diagnosis not present

## 2022-02-26 MED ORDER — SAW PALMETTO (SERENOA REPENS) 160 MG PO CAPS: 160.0000 mg | ORAL_CAPSULE | Freq: Two times a day (BID) | ORAL | Status: DC

## 2022-02-26 NOTE — Progress Notes (Signed)
Subjective:    Patient ID: Alec Hughes, male    DOB: 07-24-1966, 56 y.o.   MRN: [id number]  Alec Hughes is a 56 y.o. male presenting on 02/26/2022 for Annual Exam   HPI  Here for Annual Physical and Lab Review.   GERD Chronic problem He takes Omeprazole 28m - needs new order, works well. Previously with GI in past, he had Endoscopy (stretched esophagus as well he had some food getting stuck) and Colonoscopy 2016. Fam history polyps he did earlier than age [age] Next due in 2026.  Obesity BMI >33 Mild Elevated LDL Result shows LDL 112. Rest of panel looked good He does not take cholesterol medicine. Wt 1 lb weight loss in past 1 month Goal to improve lifestyle and work on wt loss   Right Knee, meniscus tear R Knee Pain Chronic osteoarthritis bilateral   History of Alcohol intake in past. Now reduced.  History of B12 Deficiency Previously B12 low, he has been on Vitamin B12 in general.  Low Energy Previously had Testosterone checked, and it was normal. He has good blood pressure overall.    Melanoma history Managed by Dr [facility]     Health Maintenance:   Shingrix vaccine when ready, he will return for nurse visit.  Prostate CA Screening: PSA 0.54 (01/2022). He has issue with some BPH LUTS symptoms  AUA BPH Symptom Score over past 1 month 1. Sensation of not emptying bladder post void - 0 2. Urinate less than 2 hour after finish last void - 3 3. Start/Stop several times during void - 5 4. Difficult to postpone urination - 2 5. Weak urinary stream - 5 6. Push or strain urination - 0 7. Nocturia - 1 times  Total Score: 16 (Moderate BPH symptoms)   No known family history of prostate CA.    [facility] 02/26/2022 02/06/2022  Decreased Interest 1 0  Down, Depressed, Hopeless 0 0  PHQ - 2 Score 1 0  Altered sleeping 1 0  Tired, decreased energy 2 0  Change in appetite 2 0  Feeling bad or failure about yourself  0 0   Trouble concentrating 0 0  Moving slowly or fidgety/restless 0 0  Suicidal thoughts 0 0  PHQ-9 Score 6 0  Difficult doing work/chores Not difficult at all Not difficult at all    Past Medical History:  Diagnosis Date   Attention deficit    Cancer ([facility]    malignant melanoma of skin   Depression    Erectile dysfunction of organic origin    GERD (gastroesophageal reflux disease)    History of hematuria    Plantar fasciitis    Past Surgical History:  Procedure Laterality Date   broken vertebrae     COLONOSCOPY WITH PROPOFOL N/A 09/08/2015   Procedure: COLONOSCOPY WITH PROPOFOL;  Surgeon: PHulen Luster MD;  [facility]  Service: Gastroenterology;  Laterality: N/A;   ESOPHAGOGASTRODUODENOSCOPY (EGD) WITH PROPOFOL N/A 09/08/2015   Procedure: ESOPHAGOGASTRODUODENOSCOPY (EGD) WITH PROPOFOL;  Surgeon: PHulen Luster MD;  [facility];  Service: Gastroenterology;  Laterality: N/A;   FRACTURE SURGERY     MELANOMA EXCISION     orif left wrist     orif right femur     PLANTAR FASCIA RELEASE Right 11/05/2019   Procedure: ENDOSCOPIC PLANTAR FASC. RELEASE RIGHT;  Surgeon: TAlbertine Patricia DPM;  [place]  Service: Podiatry;  Laterality: Right;  LMA WITH POP BLOCK   VENTRAL HERNIA  REPAIR N/A 12/08/2020   Procedure: HERNIA REPAIR VENTRAL ADULT;  Surgeon: Benjamine Sprague, DO;  Location: [facility];  Service: General;  Laterality: N/A;   XI ROBOTIC ASSISTED VENTRAL HERNIA N/A 10/02/2019   Procedure: XI ROBOTIC ASSISTED VENTRAL HERNIA;  Surgeon: Benjamine Sprague, DO;  Location: [facility];  Service: General;  Laterality: N/A;   Social History   Socioeconomic History   Marital status: Significant Other    Spouse name: Otila Kluver   Number of children: Not on file   Years of education: Not on file   Highest education level: Not on file  Occupational History   Not on file  Tobacco Use   Smoking status: Former    Types: Cigarettes    Quit date: 09/27/1994    Years since  quitting: 27.4   Smokeless tobacco: Never  Vaping Use   Vaping Use: Never used  Substance and Sexual Activity   Alcohol use: Yes    Alcohol/week: 14.0 standard drinks    Types: 14 Shots of liquor per week   Drug use: No   Sexual activity: Not on file  Other Topics Concern   Not on file  Social History Narrative   Not on file   Social Determinants of Health   Financial Resource Strain: Not on file  Food Insecurity: Not on file  Transportation Needs: Not on file  Physical Activity: Not on file  Stress: Not on file  Social Connections: Not on file  Intimate Partner Violence: Not on file   Family History  Problem Relation Age of Onset   Prostate cancer Neg Hx    Bladder Cancer Neg Hx    Kidney cancer Neg Hx    Current Outpatient Medications on File Prior to Visit  Medication Sig   Cyanocobalamin (VITAMIN B-12) 5000 MCG SUBL Place 5,000 mcg under the tongue daily.   ibuprofen (ADVIL) 800 MG tablet Take 1 tablet (800 mg total) by mouth every 8 (eight) hours as needed for mild pain or moderate pain.   omega-3 fish oil (MAXEPA) 1000 MG CAPS capsule Take 1,000 mg by mouth daily.   omeprazole (PRILOSEC) 20 MG capsule Take 1 capsule (20 mg total) by mouth daily before breakfast.   sildenafil (VIAGRA) 50 MG tablet Take 50 mg by mouth daily as needed for erectile dysfunction.    No current facility-administered medications on file prior to visit.    Review of Systems  Constitutional:  Negative for activity change, appetite change, chills, diaphoresis, fatigue and fever.  HENT:  Negative for congestion and hearing loss.   Eyes:  Negative for visual disturbance.  Respiratory:  Negative for cough, chest tightness, shortness of breath and wheezing.   Cardiovascular:  Negative for chest pain, palpitations and leg swelling.  Gastrointestinal:  Negative for abdominal pain, constipation, diarrhea, nausea and vomiting.  Genitourinary:  Negative for dysuria, frequency and hematuria.   Musculoskeletal:  Negative for arthralgias and neck pain.  Skin:  Negative for rash.  Neurological:  Negative for dizziness, weakness, light-headedness, numbness and headaches.  Hematological:  Negative for adenopathy.  Psychiatric/Behavioral:  Negative for behavioral problems, dysphoric mood and sleep disturbance.   Per HPI unless specifically indicated above      Objective:    BP 118/74 (BP Location: Left Arm, Cuff Size: Normal)    Pulse 60    Ht 6' (1.829 m)    Wt 243 lb 6.4 oz (110.4 kg)    SpO2 97%    BMI 33.01 kg/m   Wt Readings from Last  3 Encounters:  02/26/22 243 lb 6.4 oz (110.4 kg)  02/06/22 244 lb 3.2 oz (110.8 kg)  12/08/20 238 lb (108 kg)    Physical Exam Vitals and nursing note reviewed.  Constitutional:      General: He is not in acute distress.    Appearance: He is well-developed. He is not diaphoretic.     Comments: Well-appearing, comfortable, cooperative  HENT:     Head: Normocephalic and atraumatic.  Eyes:     General:        Right eye: No discharge.        Left eye: No discharge.     Conjunctiva/sclera: Conjunctivae normal.     Pupils: Pupils are equal, round, and reactive to light.  Neck:     Thyroid: No thyromegaly.  Cardiovascular:     Rate and Rhythm: Normal rate and regular rhythm.     Pulses: Normal pulses.     Heart sounds: Normal heart sounds. No murmur heard. Pulmonary:     Effort: Pulmonary effort is normal. No respiratory distress.     Breath sounds: Normal breath sounds. No wheezing or rales.  Abdominal:     General: Bowel sounds are normal. There is no distension.     Palpations: Abdomen is soft. There is no mass.     Tenderness: There is no abdominal tenderness.  Musculoskeletal:        General: No tenderness. Normal range of motion.     Cervical back: Normal range of motion and neck supple.     Comments: Upper / Lower Extremities: - Normal muscle tone, strength bilateral upper extremities 5/5, lower extremities 5/5   Lymphadenopathy:     Cervical: No cervical adenopathy.  Skin:    General: Skin is warm and dry.     Findings: No erythema or rash.  Neurological:     Mental Status: He is alert and oriented to person, place, and time.     Comments: Distal sensation intact to light touch all extremities  Psychiatric:        Mood and Affect: Mood normal.        Behavior: Behavior normal.        Thought Content: Thought content normal.     Comments: Well groomed, good eye contact, normal speech and thoughts      Results for orders placed or performed in visit on 02/20/22  TSH  Result Value Ref Range   TSH 3.38 0.40 - 4.50 mIU/L  Vitamin B12  Result Value Ref Range   Vitamin B-12 1,043 200 - 1,100 pg/mL  PSA  Result Value Ref Range   PSA 0.54 < OR = 4.00 ng/mL  Hemoglobin A1c  Result Value Ref Range   Hgb A1c MFr Bld 5.3 <5.7 % of total Hgb   Mean Plasma Glucose 105 mg/dL   eAG (mmol/L) 5.8 mmol/L  CBC with Differential/Platelet  Result Value Ref Range   WBC 4.5 3.8 - 10.8 Thousand/uL   RBC 4.95 4.20 - 5.80 Million/uL   Hemoglobin 13.8 13.2 - 17.1 g/dL   HCT 42.8 38.5 - 50.0 %   MCV 86.5 80.0 - 100.0 fL   MCH 27.9 27.0 - 33.0 pg   MCHC 32.2 32.0 - 36.0 g/dL   RDW 13.1 11.0 - 15.0 %   Platelets 221 140 - 400 Thousand/uL   MPV 10.7 7.5 - 12.5 fL   Neutro Abs 2,421 1,500 - 7,800 cells/uL   Lymphs Abs 1,350 850 - 3,900 cells/uL   Absolute Monocytes  455 200 - 950 cells/uL   Eosinophils Absolute 243 15 - 500 cells/uL   Basophils Absolute 32 0 - 200 cells/uL   Neutrophils Relative % 53.8 %   Total Lymphocyte 30.0 %   Monocytes Relative 10.1 %   Eosinophils Relative 5.4 %   Basophils Relative 0.7 %  Lipid panel  Result Value Ref Range   Cholesterol 177 <200 mg/dL   HDL 48 > OR = 40 mg/dL   Triglycerides 84 <150 mg/dL   LDL Cholesterol (Calc) 112 (H) mg/dL (calc)   Total CHOL/HDL Ratio 3.7 <5.0 (calc)   Non-HDL Cholesterol (Calc) 129 <130 mg/dL (calc)  COMPLETE METABOLIC PANEL WITH  GFR  Result Value Ref Range   Glucose, Bld 91 65 - 99 mg/dL   BUN 19 7 - 25 mg/dL   Creat 1.09 0.70 - 1.30 mg/dL   eGFR 81 > OR = 60 mL/min/1.50m   BUN/Creatinine Ratio NOT APPLICABLE 6 - 22 (calc)   Sodium 139 135 - 146 mmol/L   Potassium 4.5 3.5 - 5.3 mmol/L   Chloride 105 98 - 110 mmol/L   CO2 29 20 - 32 mmol/L   Calcium 9.2 8.6 - 10.3 mg/dL   Total Protein 6.6 6.1 - 8.1 g/dL   Albumin 4.2 3.6 - 5.1 g/dL   Globulin 2.4 1.9 - 3.7 g/dL (calc)   AG Ratio 1.8 1.0 - 2.5 (calc)   Total Bilirubin 0.5 0.2 - 1.2 mg/dL   Alkaline phosphatase (APISO) 63 35 - 144 U/L   AST 31 10 - 35 U/L   ALT 35 9 - 46 U/L      Assessment & Plan:   Problem List Items Addressed This Visit   None Visit Diagnoses     Annual physical exam    -  Primary   Vitamin B12 deficiency       Benign prostatic hyperplasia with weak urinary stream       Relevant Medications   saw palmetto 160 MG capsule       Updated Health Maintenance information Reviewed recent lab results with patient Encouraged improvement to lifestyle with diet and exercise Goal of weight loss  Recommend occasional 1-2 x a month BP check at home, write down readings, goal < 135 / 85.  Keep track and let me know if identifying higher numbers or any concerns.  Keep Vitamin B12 for now, in the future we can hold it and re test.  Rest of the labs are great. Mild only on the elevated LDL cholesterol. No medicines required.  For BPH - Saw Palmetto 80 to 1650mdose up to 2 times a day. Future consider Rx Tamsulosin vs Urologist.  Meds ordered this encounter  Medications   saw palmetto 160 MG capsule    Sig: Take 1 capsule (160 mg total) by mouth 2 (two) times daily.     Follow up plan: Return in about 1 year (around 02/27/2023) for 1 year fasting lab only then 1 week later Annual Physical.  Future labs ordered  AlNobie PutnamDOTavaresroup 02/26/2022, 4:06 PM

## 2022-02-26 NOTE — Patient Instructions (Addendum)
Thank you for coming to the office today. ? ?Recommend occasional 1-2 x a month BP check at home, write down readings, goal < 135 / 85. ? ?Keep track and let me know if identifying higher numbers or any concerns. ? ?Keep Vitamin B12 for now, in the future we can hold it and re test. ? ?Rest of the labs are great. Mild only on the elevated LDL cholesterol. No medicines required. ? ?Saw Palmetto 80 to '160mg'$  dose up to 2 times a day. ? ?DUE for FASTING BLOOD WORK (no food or drink after midnight before the lab appointment, only water or coffee without cream/sugar on the morning of) ? ?SCHEDULE "Lab Only" visit in the morning at the clinic for lab draw in 1 YEAR ? ?- Make sure Lab Only appointment is at about 1 week before your next appointment, so that results will be available ? ?For Lab Results, once available within 2-3 days of blood draw, you can can log in to MyChart online to view your results and a brief explanation. Also, we can discuss results at next follow-up visit. ? ? ?Please schedule a Follow-up Appointment to: Return in about 1 year (around 02/27/2023) for 1 year fasting lab only then 1 week later Annual Physical. ? ?If you have any other questions or concerns, please feel free to call the office or send a message through [place]. You may also schedule an earlier appointment if necessary. ? ?Additionally, you may be receiving a survey about your experience at our office within a few days to 1 week by e-mail or mail. We value your feedback. ? ?Nobie Putnam, DO ?[place] ?

## 2022-02-27 ENCOUNTER — Other Ambulatory Visit: Payer: Self-pay | Admitting: Family Medicine

## 2022-02-27 DIAGNOSIS — E538 Deficiency of other specified B group vitamins: Secondary | ICD-10-CM

## 2022-02-27 DIAGNOSIS — E7800 Pure hypercholesterolemia, unspecified: Secondary | ICD-10-CM

## 2022-02-27 DIAGNOSIS — Z0000 Encounter for general adult medical examination without abnormal findings: Secondary | ICD-10-CM

## 2022-02-27 DIAGNOSIS — R7309 Other abnormal glucose: Secondary | ICD-10-CM

## 2022-02-27 DIAGNOSIS — N401 Enlarged prostate with lower urinary tract symptoms: Secondary | ICD-10-CM

## 2023-02-22 ENCOUNTER — Other Ambulatory Visit: Payer: BC Managed Care – PPO

## 2023-02-22 ENCOUNTER — Other Ambulatory Visit: Payer: Self-pay

## 2023-02-22 DIAGNOSIS — E7800 Pure hypercholesterolemia, unspecified: Secondary | ICD-10-CM

## 2023-02-22 DIAGNOSIS — N401 Enlarged prostate with lower urinary tract symptoms: Secondary | ICD-10-CM

## 2023-02-22 DIAGNOSIS — Z0000 Encounter for general adult medical examination without abnormal findings: Secondary | ICD-10-CM

## 2023-02-22 DIAGNOSIS — E538 Deficiency of other specified B group vitamins: Secondary | ICD-10-CM

## 2023-02-22 DIAGNOSIS — R7309 Other abnormal glucose: Secondary | ICD-10-CM

## 2023-02-23 LAB — PSA: PSA: 0.25 ng/mL (ref <=4.00)

## 2023-02-23 LAB — VITAMIN B12: Vitamin B-12: 614 pg/mL (ref 200–1100)

## 2023-02-23 LAB — COMPLETE METABOLIC PANEL WITH GFR
AG Ratio: 1.8 (calc) (ref 1.0–2.5)
ALT: 33 U/L (ref 9–46)
AST: 29 U/L (ref 10–35)
Albumin: 4.1 g/dL (ref 3.6–5.1)
Alkaline phosphatase (APISO): 50 U/L (ref 35–144)
BUN: 21 mg/dL (ref 7–25)
CO2: 31 mmol/L (ref 20–32)
Calcium: 8.8 mg/dL (ref 8.6–10.3)
Chloride: 103 mmol/L (ref 98–110)
Creat: 1.14 mg/dL (ref 0.70–1.30)
Globulin: 2.3 g/dL (calc) (ref 1.9–3.7)
Glucose, Bld: 92 mg/dL (ref 65–99)
Potassium: 4.4 mmol/L (ref 3.5–5.3)
Sodium: 139 mmol/L (ref 135–146)
Total Bilirubin: 0.4 mg/dL (ref 0.2–1.2)
Total Protein: 6.4 g/dL (ref 6.1–8.1)
eGFR: 76 mL/min/{1.73_m2} (ref >=60)

## 2023-02-23 LAB — CBC WITH DIFFERENTIAL/PLATELET
Absolute Monocytes: 431 cells/uL (ref 200–950)
Basophils Absolute: 40 cells/uL (ref 0–200)
Basophils Relative: 0.9 %
Eosinophils Absolute: 312 cells/uL (ref 15–500)
Eosinophils Relative: 7.1 %
HCT: 42.8 % (ref 38.5–50.0)
Hemoglobin: 14.6 g/dL (ref 13.2–17.1)
Lymphs Abs: 1333 cells/uL (ref 850–3900)
MCH: 29.5 pg (ref 27.0–33.0)
MCHC: 34.1 g/dL (ref 32.0–36.0)
MCV: 86.5 fL (ref 80.0–100.0)
MPV: 10.7 fL (ref 7.5–12.5)
Monocytes Relative: 9.8 %
Neutro Abs: 2284 cells/uL (ref 1500–7800)
Neutrophils Relative %: 51.9 %
Platelets: 185 10*3/uL (ref 140–400)
RBC: 4.95 10*6/uL (ref 4.20–5.80)
RDW: 12.6 % (ref 11.0–15.0)
Total Lymphocyte: 30.3 %
WBC: 4.4 10*3/uL (ref 3.8–10.8)

## 2023-02-23 LAB — HEMOGLOBIN A1C
Hgb A1c MFr Bld: 5.5 % of total Hgb (ref <5.7)
Mean Plasma Glucose: 111 mg/dL
eAG (mmol/L): 6.2 mmol/L

## 2023-02-23 LAB — LIPID PANEL
Cholesterol: 158 mg/dL (ref <200)
HDL: 46 mg/dL (ref >=40)
LDL Cholesterol (Calc): 95 mg/dL (calc)
Non-HDL Cholesterol (Calc): 112 mg/dL (calc) (ref <130)
Total CHOL/HDL Ratio: 3.4 (calc) (ref <5.0)
Triglycerides: 76 mg/dL (ref <150)

## 2023-02-23 LAB — TSH: TSH: 2.81 mIU/L (ref 0.40–4.50)

## 2023-02-25 ENCOUNTER — Encounter: Payer: Self-pay | Admitting: Family Medicine

## 2023-03-01 ENCOUNTER — Ambulatory Visit (INDEPENDENT_AMBULATORY_CARE_PROVIDER_SITE_OTHER): Payer: BC Managed Care – PPO | Admitting: Family Medicine

## 2023-03-01 ENCOUNTER — Encounter: Payer: Self-pay | Admitting: Family Medicine

## 2023-03-01 VITALS — BP 122/74 | HR 66 | Ht 72.0 in | Wt 247.0 lb

## 2023-03-01 DIAGNOSIS — K219 Gastro-esophageal reflux disease without esophagitis: Secondary | ICD-10-CM | POA: Diagnosis not present

## 2023-03-01 DIAGNOSIS — N529 Male erectile dysfunction, unspecified: Secondary | ICD-10-CM | POA: Diagnosis not present

## 2023-03-01 DIAGNOSIS — Z0000 Encounter for general adult medical examination without abnormal findings: Secondary | ICD-10-CM

## 2023-03-01 MED ORDER — OMEPRAZOLE 20 MG PO CPDR: 20.0000 mg | DELAYED_RELEASE_CAPSULE | Freq: Every day | ORAL | 3 refills | Status: DC

## 2023-03-01 MED ORDER — SILDENAFIL CITRATE 50 MG PO TABS: 50.0000 mg | ORAL_TABLET | Freq: Every day | ORAL | 5 refills | Status: DC | PRN

## 2023-03-01 NOTE — Patient Instructions (Addendum)
Thank you for coming to the office today.  Shingles vaccine Shingrix, double check the cost and coverage. Once you are sure you are ready to proceed here, can message or call for a "Nurse Visit" for Shingles vaccine. Then repeat dose 2-6 months range.  --------------  Check your Saw Palmetto dosing 80 to '160mg'$  per dose, up to twice per day. If it is helpful okay to keep it, or if you feel unnecessary can pause We can consider rx Flomax Tamsulosin in future.  Sildenafil printed, and coupon goodrx.com to [employer] are excellent  PSA low normal.  Vitamin B12 in the 600 range, okay to stay off the supplement. We can check again next year.  Cholesterol is normal and controlled.  Colonoscopy in 2026  Can do a Cologuard sooner if you choose, but the repeat time is every 3 years.  ---------------------------------------  Call insurance find cost and coverage of the following - check the following: - Drug Tier, Preferred List, On Formulary - All will require a "Prior Authorization" from [place] first, before you can find out the cost - Find out if there is "Step Therapy" (other medicines required before you can try these)  Once you pick the one you want to try, let me know - we can get a sample ready IF we have it in stock. Then try it - and before running out of medicine, contact me back to order your Rx so we have time to get it processed.  For Weight Loss / Obesity only  Wegovy (same as Ozempic) weekly injection - start 0.'25mg'$  weekly, 1 dose per pen, single use, auto-injector Zepbound (same as Mounjaro) weekly injection  3. Saxenda - DAILY injection - start 0.'6mg'$  injection DAILY, you can increase the dose by 1 notch or 0.6 mg per week, if you don't tolerate a dose, can reduce it the next day.  4. Contrave - oral medication, appetite suppression has wellbutrin/bupropion and naltrexone in it and it can also help with appetite, it is ordered through a speciality pharmacy. -$99 per month  through mail order.  5. Phentermine - oral medication, older generation med, "burning medication" speeds up heart / blood pressure etc, but generally fairly well tolerated for short term. 1-3 months approximately. Affordable and generic.  Future make sure your insurance has weight loss coverage  WEIGHT MANAGEMENT  Dr Dennard Nip  [facility] [place], [place] [address] Ph: [phone]     DUE for FASTING BLOOD WORK (no food or drink after midnight before the lab appointment, only water or coffee without cream/sugar on the morning of)  SCHEDULE "Lab Only" visit in the morning at the clinic for lab draw in 1 YEAR  - Make sure Lab Only appointment is at about 1 week before your next appointment, so that results will be available  For Lab Results, once available within 2-3 days of blood draw, you can can log in to MyChart online to view your results and a brief explanation. Also, we can discuss results at next follow-up visit.   Please schedule a Follow-up Appointment to: Return in about 1 year (around 02/29/2024) for 1 year fasting lab only then 1 week later Annual Physical.  If you have any other questions or concerns, please feel free to call the office or send a message through [place]. You may also schedule an earlier appointment if necessary.  Additionally, you may be receiving a survey about your experience at our office  within a few days to 1 week by e-mail or mail. We value your feedback.  Nobie Putnam, DO [place]

## 2023-03-01 NOTE — Progress Notes (Unsigned)
Subjective:    Patient ID: Alec Hughes, male    DOB: [birth year], 57 y.o.   MRN: [id number]  Alec Hughes is a 57 y.o. male presenting on 03/01/2023 for Annual Exam   HPI  Here for Annual Physical and Lab Review.  GERD Chronic problem He takes Omeprazole '20mg'$  - needs new order, works well. Previously with GI in past, he had Endoscopy (stretched esophagus as well he had some food getting stuck) and Colonoscopy 2016. Fam history polyps he did earlier than age 37. Next due in 2026.   Obesity BMI >33 Mild Elevated LDL Result shows LDL 95, improved He does not take cholesterol medicine. Goal to improve lifestyle and work on wt loss   Right Knee, meniscus tear R Knee Pain Chronic osteoarthritis bilateral   History of Alcohol intake in past. Now reduced.   History of B12 Deficiency Previously B12 low, he has been on Vitamin B12 in general. Last lab B12 614 He stopped B12 vitamin to see if needed.   Low Energy Previously had Testosterone checked, and it was normal. He has good blood pressure overall.     Melanoma history Managed by Dr [facility]   BPH LUTS He admits taking for urinary flow, some reduced strength. Mild improvement on Saw Palmetto 1 dose unsure the mg, taking once per day.     Health Maintenance:   Shingrix vaccine when ready, he will return for nurse visit. He will check cost and coverage.  Colonoscopy last done 09/08/15 Dr Candace Cruise, repeat in 10 years, or next due in 2 years 2026.  PSA 0.25 (negative, previously 0.5)     02/26/2022    4:01 PM 02/06/2022   10:18 AM  Depression screen PHQ 2/9  Decreased Interest 1 0  Down, Depressed, Hopeless 0 0  PHQ - 2 Score 1 0  Altered sleeping 1 0  Tired, decreased energy 2 0  Change in appetite 2 0  Feeling bad or failure about yourself  0 0  Trouble concentrating 0 0  Moving slowly or fidgety/restless 0 0  Suicidal thoughts 0 0  PHQ-9 Score 6 0  Difficult doing work/chores Not difficult  at all Not difficult at all      02/26/2022    4:01 PM 02/06/2022   10:18 AM  GAD 7 : Generalized Anxiety Score  Nervous, Anxious, on Edge 0 0  Control/stop worrying 0 0  Worry too much - different things 0 0  Trouble relaxing 0 0  Restless 0 0  Easily annoyed or irritable 0 0  Afraid - awful might happen 0 0  Total GAD 7 Score 0 0  Anxiety Difficulty Not difficult at all Not difficult at all      Past Medical History:  Diagnosis Date   Attention deficit    Cancer [facility])    malignant melanoma of skin   Depression    Erectile dysfunction of organic origin    GERD (gastroesophageal reflux disease)    History of hematuria    Plantar fasciitis    Past Surgical History:  Procedure Laterality Date   broken vertebrae     COLONOSCOPY WITH PROPOFOL N/A 09/08/2015   Procedure: COLONOSCOPY WITH PROPOFOL;  Surgeon: Hulen Luster, MD;  Location: [facility] ENDOSCOPY;  Service: Gastroenterology;  Laterality: N/A;   ESOPHAGOGASTRODUODENOSCOPY (EGD) WITH PROPOFOL N/A 09/08/2015   Procedure: ESOPHAGOGASTRODUODENOSCOPY (EGD) WITH PROPOFOL;  Surgeon: Hulen Luster, MD;  Location: [facility] ENDOSCOPY;  Service: Gastroenterology;  Laterality: N/A;   FRACTURE  SURGERY     MELANOMA EXCISION     orif left wrist     orif right femur     PLANTAR FASCIA RELEASE Right 11/05/2019   Procedure: ENDOSCOPIC PLANTAR FASC. RELEASE RIGHT;  Surgeon: Albertine Patricia, DPM;  Location: [place];  Service: Podiatry;  Laterality: Right;  LMA WITH POP BLOCK   VENTRAL HERNIA REPAIR N/A 12/08/2020   Procedure: HERNIA REPAIR VENTRAL ADULT;  Surgeon: Benjamine Sprague, DO;  Location: [facility] ORS;  Service: General;  Laterality: N/A;   XI ROBOTIC ASSISTED VENTRAL HERNIA N/A 10/02/2019   Procedure: XI ROBOTIC ASSISTED VENTRAL HERNIA;  Surgeon: Benjamine Sprague, DO;  Location: [facility] ORS;  Service: General;  Laterality: N/A;   Social History   Socioeconomic History   Marital status: Married    Spouse name: Alec Hughes   Number of children: Not on  file   Years of education: Not on file   Highest education level: Not on file  Occupational History   Not on file  Tobacco Use   Smoking status: Former    Types: Cigarettes    Quit date: 09/27/1994    Years since quitting: 28.4   Smokeless tobacco: Never  Vaping Use   Vaping Use: Never used  Substance and Sexual Activity   Alcohol use: Yes    Alcohol/week: 14.0 standard drinks of alcohol    Types: 14 Shots of liquor per week   Drug use: No   Sexual activity: Not on file  Other Topics Concern   Not on file  Social History Narrative   Not on file   Social Determinants of Health   Financial Resource Strain: Not on file  Food Insecurity: Not on file  Transportation Needs: Not on file  Physical Activity: Not on file  Stress: Not on file  Social Connections: Not on file  Intimate Partner Violence: Not on file   Family History  Problem Relation Age of Onset   Prostate cancer Neg Hx    Bladder Cancer Neg Hx    Kidney cancer Neg Hx    Current Outpatient Medications on File Prior to Visit  Medication Sig   Cyanocobalamin (VITAMIN B-12) 5000 MCG SUBL Place 5,000 mcg under the tongue daily.   ibuprofen (ADVIL) 800 MG tablet Take 1 tablet (800 mg total) by mouth every 8 (eight) hours as needed for mild pain or moderate pain.   omega-3 fish oil (MAXEPA) 1000 MG CAPS capsule Take 1,000 mg by mouth daily.   saw palmetto 160 MG capsule Take 1 capsule (160 mg total) by mouth 2 (two) times daily.   No current facility-administered medications on file prior to visit.    Review of Systems  Constitutional:  Negative for activity change, appetite change, chills, diaphoresis, fatigue and fever.  HENT:  Negative for congestion and hearing loss.   Eyes:  Negative for visual disturbance.  Respiratory:  Negative for cough, chest tightness, shortness of breath and wheezing.   Cardiovascular:  Negative for chest pain, palpitations and leg swelling.  Gastrointestinal:  Negative for abdominal  pain, constipation, diarrhea, nausea and vomiting.  Genitourinary:  Negative for dysuria, frequency and hematuria.  Musculoskeletal:  Negative for arthralgias and neck pain.  Skin:  Negative for rash.  Neurological:  Negative for dizziness, weakness, light-headedness, numbness and headaches.  Hematological:  Negative for adenopathy.  Psychiatric/Behavioral:  Negative for behavioral problems, dysphoric mood and sleep disturbance.    Per HPI unless specifically indicated above      Objective:    BP  122/74   Pulse 66   Ht 6' (1.829 m)   Wt 247 lb (112 kg)   SpO2 96%   BMI 33.50 kg/m   Wt Readings from Last 3 Encounters:  03/01/23 247 lb (112 kg)  02/26/22 243 lb 6.4 oz (110.4 kg)  02/06/22 244 lb 3.2 oz (110.8 kg)    Physical Exam Vitals and nursing note reviewed.  Constitutional:      General: He is not in acute distress.    Appearance: He is well-developed. He is not diaphoretic.     Comments: Well-appearing, comfortable, cooperative  HENT:     Head: Normocephalic and atraumatic.  Eyes:     General:        Right eye: No discharge.        Left eye: No discharge.     Conjunctiva/sclera: Conjunctivae normal.     Pupils: Pupils are equal, round, and reactive to light.  Neck:     Thyroid: No thyromegaly.  Cardiovascular:     Rate and Rhythm: Normal rate and regular rhythm.     Pulses: Normal pulses.     Heart sounds: Normal heart sounds. No murmur heard. Pulmonary:     Effort: Pulmonary effort is normal. No respiratory distress.     Breath sounds: Normal breath sounds. No wheezing or rales.  Abdominal:     General: Bowel sounds are normal. There is no distension.     Palpations: Abdomen is soft. There is no mass.     Tenderness: There is no abdominal tenderness.  Musculoskeletal:        General: No tenderness. Normal range of motion.     Cervical back: Normal range of motion and neck supple.     Comments: Upper / Lower Extremities: - Normal muscle tone, strength  bilateral upper extremities 5/5, lower extremities 5/5  Lymphadenopathy:     Cervical: No cervical adenopathy.  Skin:    General: Skin is warm and dry.     Findings: No erythema or rash.  Neurological:     Mental Status: He is alert and oriented to person, place, and time.     Comments: Distal sensation intact to light touch all extremities  Psychiatric:        Mood and Affect: Mood normal.        Behavior: Behavior normal.        Thought Content: Thought content normal.     Comments: Well groomed, good eye contact, normal speech and thoughts      Results for orders placed or performed in visit on 02/22/23  Vitamin B12  Result Value Ref Range   Vitamin B-12 614 200 - 1,100 pg/mL  TSH  Result Value Ref Range   TSH 2.81 0.40 - 4.50 mIU/L  PSA  Result Value Ref Range   PSA 0.25 < OR = 4.00 ng/mL  Hemoglobin A1c  Result Value Ref Range   Hgb A1c MFr Bld 5.5 <5.7 % of total Hgb   Mean Plasma Glucose 111 mg/dL   eAG (mmol/L) 6.2 mmol/L  Lipid panel  Result Value Ref Range   Cholesterol 158 <200 mg/dL   HDL 46 > OR = 40 mg/dL   Triglycerides 76 <150 mg/dL   LDL Cholesterol (Calc) 95 mg/dL (calc)   Total CHOL/HDL Ratio 3.4 <5.0 (calc)   Non-HDL Cholesterol (Calc) 112 <130 mg/dL (calc)  CBC with Differential/Platelet  Result Value Ref Range   WBC 4.4 3.8 - 10.8 Thousand/uL   RBC 4.95 4.20 - 5.80  Million/uL   Hemoglobin 14.6 13.2 - 17.1 g/dL   HCT 42.8 38.5 - 50.0 %   MCV 86.5 80.0 - 100.0 fL   MCH 29.5 27.0 - 33.0 pg   MCHC 34.1 32.0 - 36.0 g/dL   RDW 12.6 11.0 - 15.0 %   Platelets 185 140 - 400 Thousand/uL   MPV 10.7 7.5 - 12.5 fL   Neutro Abs 2,284 1,500 - 7,800 cells/uL   Lymphs Abs 1,333 850 - 3,900 cells/uL   Absolute Monocytes 431 200 - 950 cells/uL   Eosinophils Absolute 312 15 - 500 cells/uL   Basophils Absolute 40 0 - 200 cells/uL   Neutrophils Relative % 51.9 %   Total Lymphocyte 30.3 %   Monocytes Relative 9.8 %   Eosinophils Relative 7.1 %   Basophils  Relative 0.9 %  COMPLETE METABOLIC PANEL WITH GFR  Result Value Ref Range   Glucose, Bld 92 65 - 99 mg/dL   BUN 21 7 - 25 mg/dL   Creat 1.14 0.70 - 1.30 mg/dL   eGFR 76 > OR = 60 mL/min/1.38m   BUN/Creatinine Ratio SEE NOTE: 6 - 22 (calc)   Sodium 139 135 - 146 mmol/L   Potassium 4.4 3.5 - 5.3 mmol/L   Chloride 103 98 - 110 mmol/L   CO2 31 20 - 32 mmol/L   Calcium 8.8 8.6 - 10.3 mg/dL   Total Protein 6.4 6.1 - 8.1 g/dL   Albumin 4.1 3.6 - 5.1 g/dL   Globulin 2.3 1.9 - 3.7 g/dL (calc)   AG Ratio 1.8 1.0 - 2.5 (calc)   Total Bilirubin 0.4 0.2 - 1.2 mg/dL   Alkaline phosphatase (APISO) 50 35 - 144 U/L   AST 29 10 - 35 U/L   ALT 33 9 - 46 U/L      Assessment & Plan:   Problem List Items Addressed This Visit     Erectile dysfunction of organic origin   Relevant Medications   sildenafil (VIAGRA) 50 MG tablet   Other Visit Diagnoses     Annual physical exam    -  Primary   Gastroesophageal reflux disease without esophagitis       Relevant Medications   omeprazole (PRILOSEC) 20 MG capsule       Updated Health Maintenance information Reviewed recent lab results with patient Encouraged improvement to lifestyle with diet and exercise Goal of weight loss  Shingles vaccine Shingrix, double check the cost and coverage. Once you are sure you are ready to proceed here, can message or call for a "Nurse Visit" for Shingles vaccine. Then repeat dose 2-6 months range.  --------------  Check your Saw Palmetto dosing 80 to '160mg'$  per dose, up to twice per day. If it is helpful okay to keep it, or if you feel unnecessary can pause We can consider rx Flomax Tamsulosin in future.  Sildenafil printed, and coupon [employer] to [employer] excellent  PSA low normal.  Vitamin B12 in the 600 range, okay to stay off the supplement. We can check again next year.  Cholesterol is normal and controlled.  Colonoscopy in 2026  Can do a Cologuard sooner if you choose, but the repeat  time is every 3 years.   Meds ordered this encounter  Medications   omeprazole (PRILOSEC) 20 MG capsule    Sig: Take 1 capsule (20 mg total) by mouth daily before breakfast.    Dispense:  90 capsule    Refill:  3   sildenafil (VIAGRA) 50  MG tablet    Sig: Take 1 tablet (50 mg total) by mouth daily as needed for erectile dysfunction.    Dispense:  90 tablet    Refill:  5      Follow up plan: Return in about 1 year (around 02/29/2024) for 1 year fasting lab only then 1 week later Annual Physical.  Future labs ordered  Nobie Putnam, [facility] 03/01/2023, 8:38 AM

## 2023-03-02 ENCOUNTER — Other Ambulatory Visit: Payer: Self-pay | Admitting: Family Medicine

## 2023-03-02 DIAGNOSIS — R7309 Other abnormal glucose: Secondary | ICD-10-CM

## 2023-03-02 DIAGNOSIS — E538 Deficiency of other specified B group vitamins: Secondary | ICD-10-CM

## 2023-03-02 DIAGNOSIS — N401 Enlarged prostate with lower urinary tract symptoms: Secondary | ICD-10-CM

## 2023-03-02 DIAGNOSIS — E7800 Pure hypercholesterolemia, unspecified: Secondary | ICD-10-CM

## 2023-03-02 DIAGNOSIS — Z0000 Encounter for general adult medical examination without abnormal findings: Secondary | ICD-10-CM

## 2023-03-06 ENCOUNTER — Encounter: Payer: Self-pay | Admitting: Family Medicine

## 2023-03-06 DIAGNOSIS — E669 Obesity, unspecified: Secondary | ICD-10-CM

## 2023-03-07 DIAGNOSIS — E669 Obesity, unspecified: Secondary | ICD-10-CM | POA: Insufficient documentation

## 2023-03-07 MED ORDER — CONTRAVE 8-90 MG PO TB12: ORAL_TABLET | ORAL | 0 refills | Status: DC

## 2023-03-07 NOTE — Addendum Note (Signed)
Addended by: Olin Hauser on: 03/07/2023 04:35 PM   Modules accepted: Orders

## 2023-04-04 ENCOUNTER — Other Ambulatory Visit: Payer: Self-pay

## 2023-04-04 DIAGNOSIS — E669 Obesity, unspecified: Secondary | ICD-10-CM

## 2023-04-04 DIAGNOSIS — E66811 Obesity, class 1: Secondary | ICD-10-CM

## 2023-04-05 MED ORDER — CONTRAVE 8-90 MG PO TB12: 2.0000 | ORAL_TABLET | Freq: Two times a day (BID) | ORAL | 0 refills | Status: DC

## 2023-05-10 ENCOUNTER — Other Ambulatory Visit: Payer: Self-pay | Admitting: Surgery

## 2023-05-13 ENCOUNTER — Encounter
Admission: RE | Admit: 2023-05-13 | Discharge: 2023-05-13 | Disposition: A | Discharge status: Home / Self Care | Payer: BC Managed Care – PPO | Source: Ambulatory Visit | Attending: Surgery | Admitting: Surgery

## 2023-05-13 ENCOUNTER — Other Ambulatory Visit: Payer: Self-pay

## 2023-05-13 HISTORY — DX: Anxiety disorder, unspecified: F41.9

## 2023-05-13 HISTORY — DX: Malignant melanoma of skin, unspecified: C43.9

## 2023-05-13 HISTORY — DX: Attention-deficit hyperactivity disorder, unspecified type: F90.9

## 2023-05-13 NOTE — Patient Instructions (Addendum)
Your procedure is scheduled on: 05/22/23 - Wednesday Report to the Registration Desk on the 1st floor of the [facility]. To find out your arrival time, please call [phone] between 1PM - 3PM on: 05/21/23 - Tuesday If your arrival time is 6:00 am, do not arrive before that time as the [facility] entrance doors do not open until 6:00 am.  REMEMBER: Instructions that are not followed completely may result in serious medical risk, up to and including death; or upon the discretion of your surgeon and anesthesiologist your surgery may need to be rescheduled.  Do not eat food after midnight the night before surgery.  No gum chewing or hard candies.  You may however, drink CLEAR liquids up to 2 hours before you are scheduled to arrive for your surgery. Do not drink anything within 2 hours of your scheduled arrival time.  Clear liquids include: - water  - apple juice without pulp - gatorade (not RED colors) - black coffee or tea (Do NOT add milk or creamers to the coffee or tea) Do NOT drink anything that is not on this list.  In addition, your doctor has ordered for you to drink the provided:  Ensure Pre-Surgery Clear Carbohydrate Drink  Drinking this carbohydrate drink up to two hours before surgery helps to reduce insulin resistance and improve patient outcomes. Please complete drinking 2 hours before scheduled arrival time.  One week prior to surgery: Stop Anti-inflammatories (NSAIDS) such as Advil, Aleve, Ibuprofen, Motrin, Naproxen, Naprosyn and Aspirin based products such as Excedrin, Goody's Powder, BC Powder.  Stop ANY OVER THE COUNTER supplements until after surgery. omega-3 fish oil (MAXEPA) , saw palmetto .  You may take Tylenol if needed for pain up until the day of surgery.  TAKE ONLY THESE MEDICATIONS THE MORNING OF SURGERY WITH A SIP OF WATER:  omeprazole (PRILOSEC) - (take one the night before and one on the morning of surgery - helps to prevent nausea after  surgery.)   HOLD sildenafil (VIAGRA) 2 days prior to your surgery.  No Alcohol for 24 hours before or after surgery.  No Smoking including e-cigarettes for 24 hours before surgery.  No chewable tobacco products for at least 6 hours before surgery.  No nicotine patches on the day of surgery.  Do not use any "recreational" drugs for at least a week (preferably 2 weeks) before your surgery.  Please be advised that the combination of cocaine and anesthesia may have negative outcomes, up to and including death. If you test positive for cocaine, your surgery will be cancelled.  On the morning of surgery brush your teeth with toothpaste and water, you may rinse your mouth with mouthwash if you wish. Do not swallow any toothpaste or mouthwash.  Use CHG Soap or wipes as directed on instruction sheet.  Do not wear jewelry, make-up, hairpins, clips or nail polish.  Do not wear lotions, powders, or perfumes.   Do not shave body hair from the neck down 48 hours before surgery.  Contact lenses, hearing aids and dentures may not be worn into surgery.  Do not bring valuables to the hospital. [facility] is not responsible for any missing/lost belongings or valuables.   Notify your doctor if there is any change in your medical condition (cold, fever, infection).  Wear comfortable clothing (specific to your surgery type) to the hospital.  After surgery, you can help prevent lung complications by doing breathing exercises.  Take deep breaths and cough every 1-2 hours. Your doctor  may order a device called an Incentive Spirometer to help you take deep breaths. When coughing or sneezing, hold a pillow firmly against your incision with both hands. This is called "splinting." Doing this helps protect your incision. It also decreases belly discomfort.  If you are being admitted to the hospital overnight, leave your suitcase in the car. After surgery it may be brought to your room.  In case of  increased patient census, it may be necessary for you, the patient, to continue your postoperative care in the Same Day Surgery department.  If you are being discharged the day of surgery, you will not be allowed to drive home. You will need a responsible individual to drive you home and stay with you for 24 hours after surgery.   If you are taking public transportation, you will need to have a responsible individual with you.  Please call the Pre-admissions Testing Dept. at [phone] if you have any questions about these instructions.  Surgery Visitation Policy:  Patients having surgery or a procedure may have two visitors.  Children under the age of 32 must have an adult with them who is not the patient.  Inpatient Visitation:    Visiting hours are 7 a.m. to 8 p.m. Up to four visitors are allowed at one time in a patient room. The visitors may rotate out with other people during the day.  One visitor age 59 or older may stay with the patient overnight and must be in the room by 8 p.m.    Preparing for Surgery with CHLORHEXIDINE GLUCONATE (CHG) Soap  Chlorhexidine Gluconate (CHG) Soap  o An antiseptic cleaner that kills germs and bonds with the skin to continue killing germs even after washing  o Used for showering the night before surgery and morning of surgery  Before surgery, you can play an important role by reducing the number of germs on your skin.  CHG (Chlorhexidine gluconate) soap is an antiseptic cleanser which kills germs and bonds with the skin to continue killing germs even after washing.  Please do not use if you have an allergy to CHG or antibacterial soaps. If your skin becomes reddened/irritated stop using the CHG.  1. Shower the NIGHT BEFORE SURGERY and the MORNING OF SURGERY with CHG soap.  2. If you choose to wash your hair, wash your hair first as usual with your normal shampoo.  3. After shampooing, rinse your hair and body thoroughly to remove the  shampoo.  4. Use CHG as you would any other liquid soap. You can apply CHG directly to the skin and wash gently with a scrungie or a clean washcloth.  5. Apply the CHG soap to your body only from the neck down. Do not use on open wounds or open sores. Avoid contact with your eyes, ears, mouth, and genitals (private parts). Wash face and genitals (private parts) with your normal soap.  6. Wash thoroughly, paying special attention to the area where your surgery will be performed.  7. Thoroughly rinse your body with warm water.  8. Do not shower/wash with your normal soap after using and rinsing off the CHG soap.  9. Pat yourself dry with a clean towel.  10. Wear clean pajamas to bed the night before surgery.  12. Place clean sheets on your bed the night of your first shower and do not sleep with pets.  13. Shower again with the CHG soap on the day of surgery prior to arriving at the hospital.  14. Do not apply any deodorants/lotions/powders.  15. Please wear clean clothes to the hospital. How to Use an Incentive Spirometer  An incentive spirometer is a tool that measures how well you are filling your lungs with each breath. Learning to take long, deep breaths using this tool can help you keep your lungs clear and active. This may help to reverse or lessen your chance of developing breathing (pulmonary) problems, especially infection. You may be asked to use a spirometer: After a surgery. If you have a lung problem or a history of smoking. After a long period of time when you have been unable to move or be active. If the spirometer includes an indicator to show the highest number that you have reached, your health care provider or respiratory therapist will help you set a goal. Keep a log of your progress as told by your health care provider. What are the risks? Breathing too quickly may cause dizziness or cause you to pass out. Take your time so you do not get dizzy or light-headed. If  you are in pain, you may need to take pain medicine before doing incentive spirometry. It is harder to take a deep breath if you are having pain. How to use your incentive spirometer  Sit up on the edge of your bed or on a chair. Hold the incentive spirometer so that it is in an upright position. Before you use the spirometer, breathe out normally. Place the mouthpiece in your mouth. Make sure your lips are closed tightly around it. Breathe in slowly and as deeply as you can through your mouth, causing the piston or the ball to rise toward the top of the chamber. Hold your breath for 3-5 seconds, or for as long as possible. If the spirometer includes a coach indicator, use this to guide you in breathing. Slow down your breathing if the indicator goes above the marked areas. Remove the mouthpiece from your mouth and breathe out normally. The piston or ball will return to the bottom of the chamber. Rest for a few seconds, then repeat the steps 10 or more times. Take your time and take a few normal breaths between deep breaths so that you do not get dizzy or light-headed. Do this every 1-2 hours when you are awake. If the spirometer includes a goal marker to show the highest number you have reached (best effort), use this as a goal to work toward during each repetition. After each set of 10 deep breaths, cough a few times. This will help to make sure that your lungs are clear. If you have an incision on your chest or abdomen from surgery, place a pillow or a rolled-up towel firmly against the incision when you cough. This can help to reduce pain while taking deep breaths and coughing. General tips When you are able to get out of bed: Walk around often. Continue to take deep breaths and cough in order to clear your lungs. Keep using the incentive spirometer until your health care provider says it is okay to stop using it. If you have been in the hospital, you may be told to keep using the spirometer  at home. Contact a health care provider if: You are having difficulty using the spirometer. You have trouble using the spirometer as often as instructed. Your pain medicine is not giving enough relief for you to use the spirometer as told. You have a fever. Get help right away if: You develop shortness of breath. You develop  a cough with bloody mucus from the lungs. You have fluid or blood coming from an incision site after you cough. Summary An incentive spirometer is a tool that can help you learn to take long, deep breaths to keep your lungs clear and active. You may be asked to use a spirometer after a surgery, if you have a lung problem or a history of smoking, or if you have been inactive for a long period of time. Use your incentive spirometer as instructed every 1-2 hours while you are awake. If you have an incision on your chest or abdomen, place a pillow or a rolled-up towel firmly against your incision when you cough. This will help to reduce pain. Get help right away if you have shortness of breath, you cough up bloody mucus, or blood comes from your incision when you cough. This information is not intended to replace advice given to you by your health care provider. Make sure you discuss any questions you have with your health care provider. Document Revised: 02/29/2020 Document Reviewed: 02/29/2020 Elsevier Patient Education  2023 [employer].

## 2023-05-22 ENCOUNTER — Ambulatory Visit: Payer: BC Managed Care – PPO | Admitting: Urgent Care

## 2023-05-22 ENCOUNTER — Other Ambulatory Visit: Payer: Self-pay

## 2023-05-22 ENCOUNTER — Ambulatory Visit
Admission: RE | Admit: 2023-05-22 | Discharge: 2023-05-22 | Disposition: A | Discharge status: Home / Self Care | Payer: BC Managed Care – PPO | Attending: Surgery | Admitting: Surgery

## 2023-05-22 ENCOUNTER — Encounter
Admission: RE | Disposition: A | Discharge status: Home / Self Care | Payer: Self-pay | Source: Home / Self Care | Attending: Surgery

## 2023-05-22 ENCOUNTER — Ambulatory Visit: Payer: BC Managed Care – PPO | Admitting: Anesthesiology

## 2023-05-22 ENCOUNTER — Encounter: Payer: Self-pay | Admitting: Surgery

## 2023-05-22 DIAGNOSIS — X58XXXA Exposure to other specified factors, initial encounter: Secondary | ICD-10-CM | POA: Diagnosis not present

## 2023-05-22 DIAGNOSIS — S83231A Complex tear of medial meniscus, current injury, right knee, initial encounter: Secondary | ICD-10-CM | POA: Insufficient documentation

## 2023-05-22 DIAGNOSIS — Z09 Encounter for follow-up examination after completed treatment for conditions other than malignant neoplasm: Secondary | ICD-10-CM | POA: Diagnosis not present

## 2023-05-22 DIAGNOSIS — M1711 Unilateral primary osteoarthritis, right knee: Secondary | ICD-10-CM | POA: Diagnosis not present

## 2023-05-22 DIAGNOSIS — Z87891 Personal history of nicotine dependence: Secondary | ICD-10-CM | POA: Diagnosis not present

## 2023-05-22 DIAGNOSIS — N401 Enlarged prostate with lower urinary tract symptoms: Secondary | ICD-10-CM

## 2023-05-22 DIAGNOSIS — S83281A Other tear of lateral meniscus, current injury, right knee, initial encounter: Secondary | ICD-10-CM | POA: Diagnosis not present

## 2023-05-22 HISTORY — PX: KNEE ARTHROSCOPY: SHX127

## 2023-05-22 SURGERY — ARTHROSCOPY, KNEE
Anesthesia: General | Site: Knee | Laterality: Right

## 2023-05-22 MED ORDER — EPINEPHRINE PF 1 MG/ML IJ SOLN
INTRAMUSCULAR | Status: AC
  Filled 2023-05-22: qty 1

## 2023-05-22 MED ORDER — OXYCODONE HCL 5 MG/5ML PO SOLN: 5.0000 mg | Freq: Once | ORAL | Status: DC | PRN

## 2023-05-22 MED ORDER — LIDOCAINE HCL (PF) 1 % IJ SOLN
INTRAMUSCULAR | Status: AC
  Filled 2023-05-22: qty 30

## 2023-05-22 MED ORDER — EPHEDRINE SULFATE (PRESSORS) 50 MG/ML IJ SOLN
INTRAMUSCULAR | Status: DC | PRN
  Administered 2023-05-22: 5 mg via INTRAVENOUS

## 2023-05-22 MED ORDER — KETOROLAC TROMETHAMINE 30 MG/ML IJ SOLN
INTRAMUSCULAR | Status: AC
  Filled 2023-05-22: qty 1

## 2023-05-22 MED ORDER — ONDANSETRON HCL 4 MG/2ML IJ SOLN
INTRAMUSCULAR | Status: AC
  Filled 2023-05-22: qty 2

## 2023-05-22 MED ORDER — ACETAMINOPHEN 10 MG/ML IV SOLN: 1000.0000 mg | Freq: Once | INTRAVENOUS | Status: DC | PRN

## 2023-05-22 MED ORDER — BUPIVACAINE-EPINEPHRINE (PF) 0.5% -1:200000 IJ SOLN
INTRAMUSCULAR | Status: DC | PRN
  Administered 2023-05-22: 20 mL via PERINEURAL

## 2023-05-22 MED ORDER — CEFAZOLIN SODIUM-DEXTROSE 2-4 GM/100ML-% IV SOLN
2.0000 g | INTRAVENOUS | Status: AC
  Administered 2023-05-22: 2 g via INTRAVENOUS

## 2023-05-22 MED ORDER — ONDANSETRON HCL 4 MG PO TABS
4.0000 mg | ORAL_TABLET | Freq: Four times a day (QID) | ORAL | Status: DC | PRN
Start: 2023-05-22 — End: 2023-05-22

## 2023-05-22 MED ORDER — BUPIVACAINE HCL (PF) 0.5 % IJ SOLN
INTRAMUSCULAR | Status: AC
  Filled 2023-05-22: qty 60

## 2023-05-22 MED ORDER — MIDAZOLAM HCL 2 MG/2ML IJ SOLN
INTRAMUSCULAR | Status: DC | PRN
  Administered 2023-05-22: 2 mg via INTRAVENOUS

## 2023-05-22 MED ORDER — LACTATED RINGERS IV SOLN: INTRAVENOUS | Status: DC

## 2023-05-22 MED ORDER — CEFAZOLIN SODIUM-DEXTROSE 2-4 GM/100ML-% IV SOLN
INTRAVENOUS | Status: AC
  Filled 2023-05-22: qty 100

## 2023-05-22 MED ORDER — FENTANYL CITRATE (PF) 100 MCG/2ML IJ SOLN: 25.0000 ug | INTRAMUSCULAR | Status: DC | PRN

## 2023-05-22 MED ORDER — GLYCOPYRROLATE 0.2 MG/ML IJ SOLN
INTRAMUSCULAR | Status: DC | PRN
  Administered 2023-05-22 (×2): .1 mg via INTRAVENOUS

## 2023-05-22 MED ORDER — METOCLOPRAMIDE HCL 5 MG/ML IJ SOLN: 5.0000 mg | Freq: Three times a day (TID) | INTRAMUSCULAR | Status: DC | PRN

## 2023-05-22 MED ORDER — PROPOFOL 10 MG/ML IV BOLUS
INTRAVENOUS | Status: DC | PRN
  Administered 2023-05-22: 200 mg via INTRAVENOUS

## 2023-05-22 MED ORDER — LIDOCAINE HCL (CARDIAC) PF 100 MG/5ML IV SOSY
PREFILLED_SYRINGE | INTRAVENOUS | Status: DC | PRN
  Administered 2023-05-22: 60 mg via INTRAVENOUS

## 2023-05-22 MED ORDER — FENTANYL CITRATE (PF) 100 MCG/2ML IJ SOLN
INTRAMUSCULAR | Status: AC
  Filled 2023-05-22: qty 2

## 2023-05-22 MED ORDER — ORAL CARE MOUTH RINSE: 15.0000 mL | Freq: Once | OROMUCOSAL | Status: AC

## 2023-05-22 MED ORDER — ONDANSETRON HCL 4 MG/2ML IJ SOLN: 4.0000 mg | Freq: Once | INTRAMUSCULAR | Status: DC | PRN

## 2023-05-22 MED ORDER — GLYCOPYRROLATE 0.2 MG/ML IJ SOLN
INTRAMUSCULAR | Status: AC
  Filled 2023-05-22: qty 1

## 2023-05-22 MED ORDER — ONDANSETRON HCL 4 MG/2ML IJ SOLN
4.0000 mg | Freq: Four times a day (QID) | INTRAMUSCULAR | Status: DC | PRN
Start: 2023-05-22 — End: 2023-05-22

## 2023-05-22 MED ORDER — DEXAMETHASONE SODIUM PHOSPHATE 10 MG/ML IJ SOLN
INTRAMUSCULAR | Status: DC | PRN
  Administered 2023-05-22: 10 mg via INTRAVENOUS

## 2023-05-22 MED ORDER — KETOROLAC TROMETHAMINE 30 MG/ML IJ SOLN
30.0000 mg | Freq: Once | INTRAMUSCULAR | Status: AC
  Administered 2023-05-22: 30 mg via INTRAVENOUS

## 2023-05-22 MED ORDER — HYDROCODONE-ACETAMINOPHEN 5-325 MG PO TABS: 1.0000 | ORAL_TABLET | Freq: Four times a day (QID) | ORAL | 0 refills | Status: DC | PRN

## 2023-05-22 MED ORDER — RINGERS IRRIGATION IR SOLN
Status: DC | PRN
  Administered 2023-05-22: 6000 mL

## 2023-05-22 MED ORDER — SODIUM CHLORIDE 0.9 % IV SOLN: INTRAVENOUS | Status: DC

## 2023-05-22 MED ORDER — LIDOCAINE HCL (PF) 1 % IJ SOLN
INTRAMUSCULAR | Status: DC | PRN
  Administered 2023-05-22: 60 mL via INTRAMUSCULAR

## 2023-05-22 MED ORDER — MIDAZOLAM HCL 2 MG/2ML IJ SOLN
INTRAMUSCULAR | Status: AC
  Filled 2023-05-22: qty 2

## 2023-05-22 MED ORDER — EPHEDRINE 5 MG/ML INJ
INTRAVENOUS | Status: AC
  Filled 2023-05-22: qty 5

## 2023-05-22 MED ORDER — CHLORHEXIDINE GLUCONATE 0.12 % MT SOLN
15.0000 mL | Freq: Once | OROMUCOSAL | Status: AC
  Administered 2023-05-22: 15 mL via OROMUCOSAL

## 2023-05-22 MED ORDER — FENTANYL CITRATE (PF) 100 MCG/2ML IJ SOLN
INTRAMUSCULAR | Status: DC | PRN
  Administered 2023-05-22 (×4): 25 ug via INTRAVENOUS

## 2023-05-22 MED ORDER — OXYCODONE HCL 5 MG PO TABS: 5.0000 mg | ORAL_TABLET | Freq: Once | ORAL | Status: DC | PRN

## 2023-05-22 MED ORDER — SAW PALMETTO (SERENOA REPENS) 160 MG PO CAPS: 160.0000 mg | ORAL_CAPSULE | Freq: Every day | ORAL | Status: DC

## 2023-05-22 MED ORDER — ONDANSETRON HCL 4 MG/2ML IJ SOLN
INTRAMUSCULAR | Status: DC | PRN
  Administered 2023-05-22: 4 mg via INTRAVENOUS

## 2023-05-22 MED ORDER — ACETAMINOPHEN 325 MG PO TABS: 325.0000 mg | ORAL_TABLET | Freq: Four times a day (QID) | ORAL | Status: DC | PRN

## 2023-05-22 MED ORDER — DEXAMETHASONE SODIUM PHOSPHATE 10 MG/ML IJ SOLN
INTRAMUSCULAR | Status: AC
  Filled 2023-05-22: qty 1

## 2023-05-22 MED ORDER — HYDROCODONE-ACETAMINOPHEN 5-325 MG PO TABS: 1.0000 | ORAL_TABLET | ORAL | Status: DC | PRN

## 2023-05-22 MED ORDER — METOCLOPRAMIDE HCL 10 MG PO TABS
5.0000 mg | ORAL_TABLET | Freq: Three times a day (TID) | ORAL | Status: DC | PRN
Start: 2023-05-22 — End: 2023-05-22

## 2023-05-22 MED ORDER — CHLORHEXIDINE GLUCONATE 0.12 % MT SOLN
OROMUCOSAL | Status: AC
  Filled 2023-05-22: qty 15

## 2023-05-22 SURGICAL SUPPLY — 44 items
APL PRP STRL LF DISP 70% ISPRP (MISCELLANEOUS) ×1
BLADE FULL RADIUS 3.5 (BLADE) ×1 IMPLANT
BLADE SHAVER 4.5X7 STR FR (MISCELLANEOUS) ×1 IMPLANT
BNDG CMPR 5X62 HK CLSR LF (GAUZE/BANDAGES/DRESSINGS) ×1
BNDG CMPR 6"X 5 YARDS HK CLSR (GAUZE/BANDAGES/DRESSINGS) ×1
BNDG ELASTIC 6INX 5YD STR LF (GAUZE/BANDAGES/DRESSINGS) ×1 IMPLANT
BNDG ESMARCH 6 X 12 STRL LF (GAUZE/BANDAGES/DRESSINGS) ×1
BNDG ESMARCH 6X12 STRL LF (GAUZE/BANDAGES/DRESSINGS) ×1 IMPLANT
CATH ROBINSON RED A/P 12FR (CATHETERS) IMPLANT
CHLORAPREP W/TINT 26 (MISCELLANEOUS) ×1 IMPLANT
COLLECTOR GRAFT TISSUE (SYSTAGENIX WOUND MANAGEMENT) ×1
CUFF TOURN SGL QUICK 24 (TOURNIQUET CUFF)
CUFF TOURN SGL QUICK 34 (TOURNIQUET CUFF)
CUFF TRNQT CYL 24X4X16.5-23 (TOURNIQUET CUFF) IMPLANT
CUFF TRNQT CYL 34X4.125X (TOURNIQUET CUFF) IMPLANT
CUP MEDICINE 2OZ PLAST GRAD ST (MISCELLANEOUS) ×1 IMPLANT
DRAPE ARTHRO LIMB 89X125 STRL (DRAPES) ×1 IMPLANT
DRAPE IMP U-DRAPE 54X76 (DRAPES) ×1 IMPLANT
ELECT REM PT RETURN 9FT ADLT (ELECTROSURGICAL) ×1
ELECTRODE REM PT RTRN 9FT ADLT (ELECTROSURGICAL) ×1 IMPLANT
GAUZE SPONGE 4X4 12PLY STRL (GAUZE/BANDAGES/DRESSINGS) ×1 IMPLANT
GLOVE BIO SURGEON STRL SZ8 (GLOVE) ×2 IMPLANT
GLOVE INDICATOR 8.0 STRL GRN (GLOVE) ×1 IMPLANT
GOWN STRL REUS W/ TWL LRG LVL3 (GOWN DISPOSABLE) ×1 IMPLANT
GOWN STRL REUS W/ TWL XL LVL3 (GOWN DISPOSABLE) ×2 IMPLANT
GOWN STRL REUS W/TWL LRG LVL3 (GOWN DISPOSABLE) ×1
GOWN STRL REUS W/TWL XL LVL3 (GOWN DISPOSABLE) ×2
IV LACTATED RINGER IRRG 3000ML (IV SOLUTION) ×2
IV LR IRRIG 3000ML ARTHROMATIC (IV SOLUTION) ×1 IMPLANT
KIT TURNOVER KIT A (KITS) ×1 IMPLANT
MANIFOLD NEPTUNE II (INSTRUMENTS) ×2 IMPLANT
NDL HYPO 21X1.5 SAFETY (NEEDLE) ×1 IMPLANT
NEEDLE HYPO 21X1.5 SAFETY (NEEDLE) ×1 IMPLANT
PACK ARTHROSCOPY KNEE (MISCELLANEOUS) ×1 IMPLANT
SLEEVE REMOTE CONTROL 5X12 (DRAPES) IMPLANT
SUT PROLENE 4 0 PS 2 18 (SUTURE) ×1 IMPLANT
SYR 30ML LL (SYRINGE) ×1 IMPLANT
SYR 50ML LL SCALE MARK (SYRINGE) ×1 IMPLANT
SYR TOOMEY 50ML (SYRINGE) IMPLANT
TISSUE GRAFT COLLECTOR (SYSTAGENIX WOUND MANAGEMENT) IMPLANT
TRAP FLUID SMOKE EVACUATOR (MISCELLANEOUS) ×1 IMPLANT
TUBE SET DOUBLEFLO INFLOW (TUBING) ×1 IMPLANT
WAND WEREWOLF FLOW 90D (MISCELLANEOUS) ×1 IMPLANT
WATER STERILE IRR 500ML POUR (IV SOLUTION) ×1 IMPLANT

## 2023-05-22 NOTE — Anesthesia Postprocedure Evaluation (Signed)
Anesthesia Post Note  Patient: Alec Hughes  Procedure(s) Performed: RIGHT KNEE ARTHROSCOPY WITH ABRASION CHONDROPLASTY, PARTIAL MEDIAL AND/OR LATERAL MENISCECTOMIES, AND REINJECTION OF HARVESTED INFRAPATELLAR FAT CELLS (Right: Knee)  Patient location during evaluation: PACU Anesthesia Type: General Level of consciousness: awake and alert Pain management: pain level controlled Vital Signs Assessment: post-procedure vital signs reviewed and stable Respiratory status: spontaneous breathing, nonlabored ventilation, respiratory function stable and patient connected to nasal cannula oxygen Cardiovascular status: blood pressure returned to baseline and stable Postop Assessment: no apparent nausea or vomiting Anesthetic complications: no   No notable events documented.   Last Vitals:  Vitals:   05/22/23 1000 05/22/23 1015  BP: 117/80 122/84  Pulse: 61 60  Resp: 14 16  Temp:    SpO2: 100% 100%    Last Pain:  Vitals:   05/22/23 1015  TempSrc:   PainSc: 0-No pain                 Louie Boston

## 2023-05-22 NOTE — Op Note (Signed)
05/22/2023  10:07 AM  Patient:   Alec Hughes  Pre-Op Diagnosis:   Complex medial and lateral meniscus tears with underlying degenerative joint disease, right knee.  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  Surgeon:   Maryagnes Amos, MD  Anesthesia:   General LMA  Findings:   As above. There were areas of extensive grade III-IV chondromalacia changes involving the entire central and posterior portions of the lateral tibial plateau, as well as of the central portion of the femoral trochlea. The medial meniscus demonstrated an area of tearing in the posterior medial corner but otherwise was intact. The lateral meniscus demonstrated a significant partial-thickness tear of the posterior portion of the meniscus extending to the root area, as well as a more horizontal tear involving the anterolateral portion of the meniscus. There were grade I chondromalacia changes involving the medial tibial plateau and patella. The medial and lateral femoral condyles both were in satisfactory condition, as were the anterior and posterior cruciate ligaments  Complications:   None  EBL:   5 cc.  Total fluids:   600 cc of crystalloid.  Tourniquet time:   None  Drains:   None  Closure:   4-0 Prolene interrupted sutures.  Brief clinical note:   The patient is a 57 year old male with a long history of gradually worsening lateral and posterolateral right knee pain. The patient's symptoms have progressed despite medications, activity modification, a steroid injection, etc. His history and examination are consistent with a lateral meniscus tear with underlying degenerative joint disease, both of which were confirmed by MRI scan. The MRI scan also  demonstrated tearing of the medial meniscus. The patient presents at this time for arthroscopy, debridement, and partial medial and lateral meniscectomies.  Procedure:   The patient was brought into the operating room and lain in the supine position. After adequate general laryngeal mask anesthesia was obtained, a timeout was performed to verify the appropriate side. The patient's right knee was injected sterilely using a solution of 30 cc of 1% lidocaine and 30 cc of 0.5% Sensorcaine with epinephrine. The right lower extremity was prepped with ChloraPrep solution before being draped sterilely. Preoperative antibiotics were administered. The expected portal sites were injected with 0.5% Sensorcaine with epinephrine before the camera was placed in the anterolateral portal and instrumentation performed through the anteromedial portal.   The knee was sequentially examined beginning in the suprapatellar pouch, then progressing to the patellofemoral space, the medial gutter and compartment, the notch, and finally the lateral compartment and gutter. The findings were as described above. Abundant reactive synovial tissues anteriorly, medially, and laterally were debrided using the full-radius resector in order to improve visualization.  These tissues were collected using the Arthrex graft net system for later reinjection.  The area of tearing involving the posterior medial portion of the medial meniscus was debrided back to stable margins using a combination of the straight baskets and full-radius resector.  Subsequent probing of the remaining rim demonstrated excellent stability.  Laterally, there was a large flap tear involving the posterior portion of the lateral meniscus.  This area was debrided back to stable margins using a combination of the straight baskets and full-radius resector.  Again, subsequent probing of the rim demonstrated satisfactory stability.  There was approximately 50% compromise of the  posterior root.  More laterally/anterolaterally, there was a horizontal configured tear with instability of the inferior flap.  This flap was debrided back to stable margins using the full-radius resector and side-biting baskets.  Again, subsequent probing of this rim demonstrated good stability.  The areas of grade III-IV chondromalacia involving the lateral tibial plateau and femoral trochlea both were debrided back to stable margins using the full-radius resector. The ArthroCare wand was inserted and used to lightly "anneal" the margins of these areas to help minimize further deterioration/breakdown of the articular cartilage. In addition, the ArthroCare wand was used to obtain hemostasis. The instruments were removed from the joint after suctioning the excess fluid.   The portal sites were closed using 4-0 Prolene interrupted sutures before a sterile bulky dressing was applied to the knee. Prior to closing the medial portal, the collected fat cells were reinjected into the knee via a red rubber catheter using a Toomey syringe. The patient was then awakened, extubated, and returned to the recovery room in satisfactory condition after tolerating the procedure well.

## 2023-05-22 NOTE — Anesthesia Procedure Notes (Signed)
Procedure Name: LMA Insertion Date/Time: 05/22/2023 8:55 AM  Performed by: Karoline Caldwell, CRNAPre-anesthesia Checklist: Patient identified, Patient being monitored, Timeout performed, Emergency Drugs available and Suction available Patient Re-evaluated:Patient Re-evaluated prior to induction Oxygen Delivery Method: Circle system utilized Preoxygenation: Pre-oxygenation with 100% oxygen Induction Type: IV induction Ventilation: Mask ventilation without difficulty LMA: LMA inserted LMA Size: 5.0 Tube type: Oral Number of attempts: 1 Placement Confirmation: positive ETCO2 and breath sounds checked- equal and bilateral Tube secured with: Tape Dental Injury: Teeth and Oropharynx as per pre-operative assessment

## 2023-05-22 NOTE — Anesthesia Preprocedure Evaluation (Addendum)
Anesthesia Evaluation  Patient identified by MRN, date of birth, ID band Patient awake    Reviewed: Allergy & Precautions, NPO status , Patient's Chart, lab work & pertinent test results  History of Anesthesia Complications Negative for: history of anesthetic complications  Airway Mallampati: III   Neck ROM: Full    Dental  (+) Missing   Pulmonary former smoker (quit 1995)   Pulmonary exam normal breath sounds clear to auscultation       Cardiovascular Exercise Tolerance: Good negative cardio ROS Normal cardiovascular exam Rhythm:Regular Rate:Normal     Neuro/Psych  PSYCHIATRIC DISORDERS (ADHD) Anxiety Depression    negative neurological ROS     GI/Hepatic ,GERD  ,,  Endo/Other  negative endocrine ROS    Renal/GU negative Renal ROS     Musculoskeletal  (+) Arthritis ,  Melanoma    Abdominal   Peds  Hematology negative hematology ROS (+)   Anesthesia Other Findings   Reproductive/Obstetrics                             Anesthesia Physical Anesthesia Plan  ASA: 2  Anesthesia Plan: General   Post-op Pain Management:    Induction: Intravenous  PONV Risk Score and Plan: 2 and Ondansetron, Dexamethasone and Treatment may vary due to age or medical condition  Airway Management Planned: LMA  Additional Equipment:   Intra-op Plan:   Post-operative Plan: Extubation in OR  Informed Consent: I have reviewed the patients History and Physical, chart, labs and discussed the procedure including the risks, benefits and alternatives for the proposed anesthesia with the patient or authorized representative who has indicated his/her understanding and acceptance.     Dental advisory given  Plan Discussed with: CRNA  Anesthesia Plan Comments: (Patient consented for risks of anesthesia including but not limited to:  - adverse reactions to medications - damage to eyes, teeth, lips or other  oral mucosa - nerve damage due to positioning  - sore throat or hoarseness - damage to heart, brain, nerves, lungs, other parts of body or loss of life  Informed patient about role of CRNA in peri- and intra-operative care.  Patient voiced understanding.)       Anesthesia Quick Evaluation

## 2023-05-22 NOTE — Discharge Instructions (Addendum)
Orthopedic discharge instructions: Keep dressing dry and intact.  May shower after dressing changed on post-op day #4 (Sunday).  Cover sutures with Band-Aids after drying off. Apply ice frequently to knee. Take ibuprofen 600-800 mg TID with meals for 5-7 days, then as necessary. Take pain medication as prescribed or ES Tylenol when needed.  May weight-bear as tolerated - use crutches or walker as needed. Follow-up in 10-14 days or as scheduled.  AMBULATORY SURGERY  DISCHARGE INSTRUCTIONS   The drugs that you were given will stay in your system until tomorrow so for the next 24 hours you should not:  Drive an automobile Make any legal decisions Drink any alcoholic beverage   You may resume regular meals tomorrow.  Today it is better to start with liquids and gradually work up to solid foods.  You may eat anything you prefer, but it is better to start with liquids, then soup and crackers, and gradually work up to solid foods.   Please notify your doctor immediately if you have any unusual bleeding, trouble breathing, redness and pain at the surgery site, drainage, fever, or pain not relieved by medication.    Additional Instructions:        Please contact your physician with any problems or Same Day Surgery at [phone], Monday through Friday 6 am to 4 pm, or [place] at [facility] number at [phone].

## 2023-05-22 NOTE — Transfer of Care (Signed)
Immediate Anesthesia Transfer of Care Note  Patient: Alec Hughes  Procedure(s) Performed: RIGHT KNEE ARTHROSCOPY WITH ABRASION CHONDROPLASTY, PARTIAL MEDIAL AND/OR LATERAL MENISCECTOMIES, AND REINJECTION OF HARVESTED INFRAPATELLAR FAT CELLS (Right: Knee)  Patient Location: PACU  Anesthesia Type:General  Level of Consciousness: sedated, drowsy, and responds to stimulation  Airway & Oxygen Therapy: Patient Spontanous Breathing and Patient connected to face mask oxygen  Post-op Assessment: Report given to RN and Post -op Vital signs reviewed and stable  Post vital signs: Reviewed and stable  Last Vitals:  Vitals Value Taken Time  BP 116/78 05/22/23 0956  Temp    Pulse 63 05/22/23 0958  Resp 15 05/22/23 0958  SpO2 100 % 05/22/23 0958  Vitals shown include unvalidated device data.  Last Pain:  Vitals:   05/22/23 0710  TempSrc: Temporal  PainSc: 0-No pain         Complications: No notable events documented.
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
## 2023-06-26 IMAGING — MR MR KNEE*R* W/O CM
7 series · 40 of 40 positions shown · non-contrast
Comparison: None.

CLINICAL DATA: Right lateral/posterior knee pain for 2 weeks.

EXAM:
MRI OF THE RIGHT KNEE WITHOUT CONTRAST
TECHNIQUE: Multiplanar, multisequence MR imaging of the knee was performed. No
intravenous contrast was administered.

[Series 8: T2 fat-sat · axial · right · 4.0mm · 0.50mm/px · z∈[-88,+55]mm · 5 of 30 slices shown (1 of 3)]
[im 1/30]
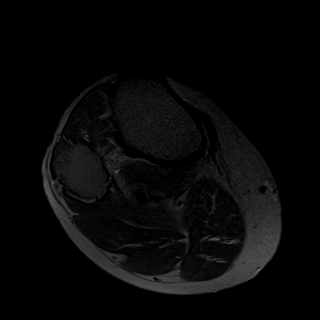
[im 8/30]
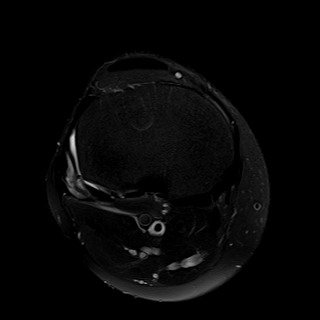
[im 15/30]
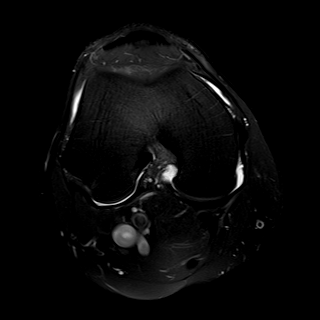
[im 22/30]
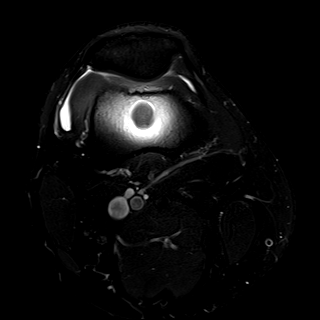
[im 30/30]
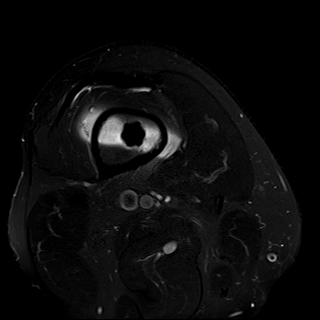

[Series 9: T2 fat-sat · coronal · right · 4.0mm · 0.59mm/px · 6 of 30 slices shown (2 of 3)]
[im 1/30]
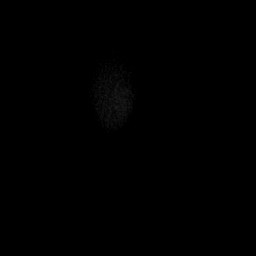
[im 6/30]
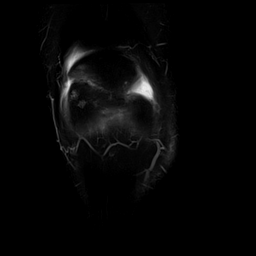
[im 12/30]
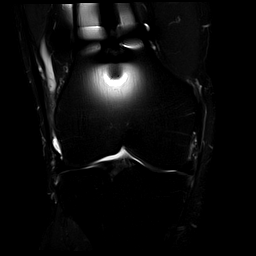
[im 18/30]
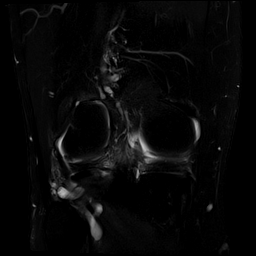
[im 24/30]
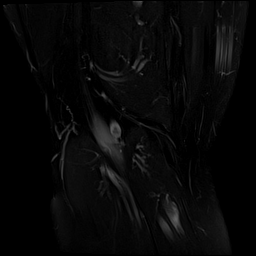
[im 30/30]
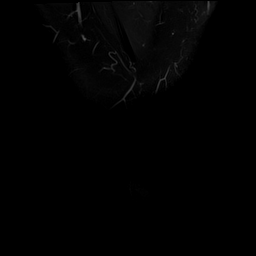

[Series 10: PD fat-sat · sagittal · right · 3.0mm · 0.59mm/px · 7 of 34 slices shown]
[im 1/34]
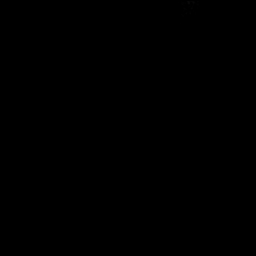
[im 6/34]
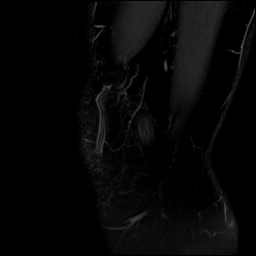
[im 12/34]
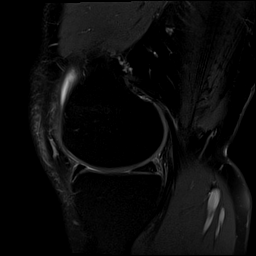
[im 17/34]
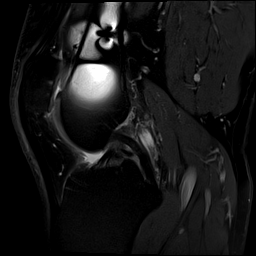
[im 23/34]
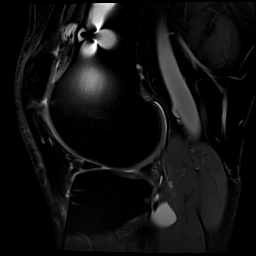
[im 28/34]
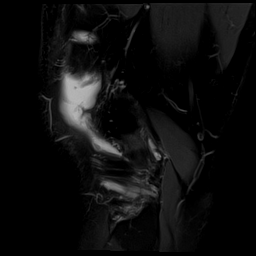
[im 34/34]
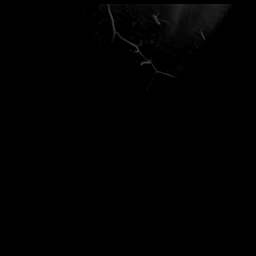

[Series 11: T1 · coronal · right · 4.0mm · 0.59mm/px · 6 of 30 slices shown]
[im 1/30]
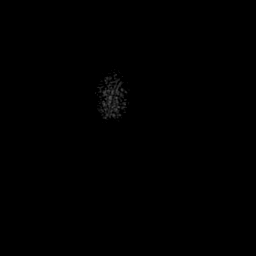
[im 6/30]
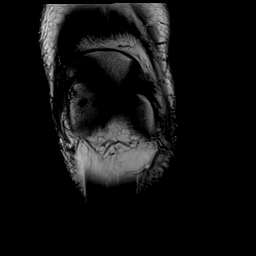
[im 12/30]
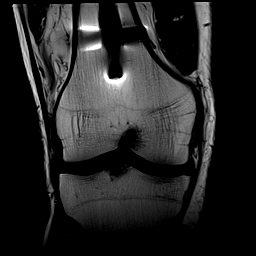
[im 18/30]
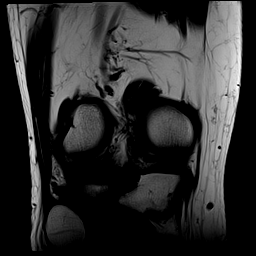
[im 24/30]
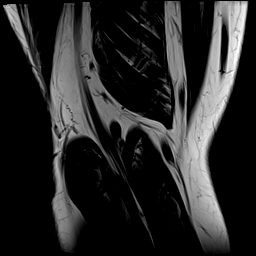
[im 30/30]
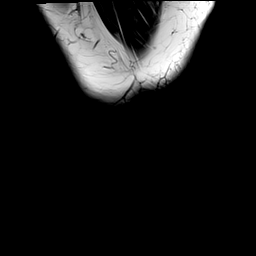

[Series 12: cor pd_high-bw · coronal · right · 4.0mm · 0.62mm/px · 6 of 30 slices shown]
[im 1/30]
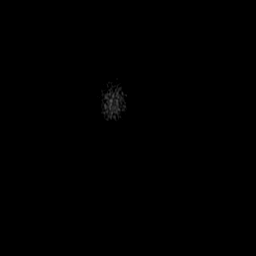
[im 6/30]
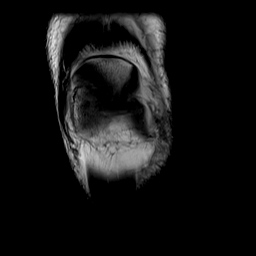
[im 12/30]
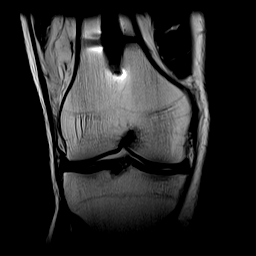
[im 18/30]
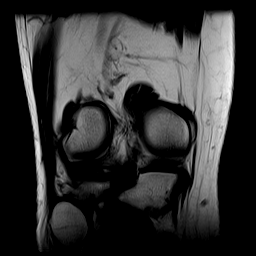
[im 24/30]
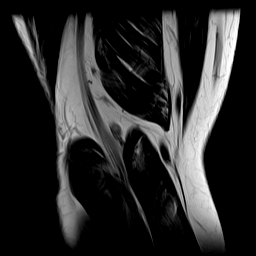
[im 30/30]
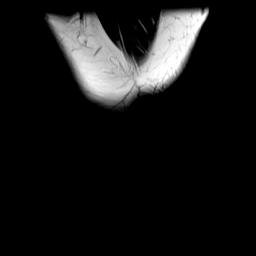

[Series 13: T2 fat-sat · sagittal · right · 3.0mm · 0.59mm/px · 7 of 36 slices shown (3 of 3)]
[im 1/36]
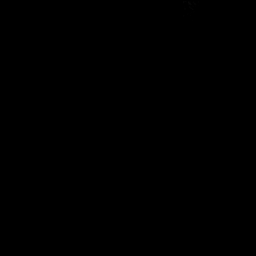
[im 6/36]
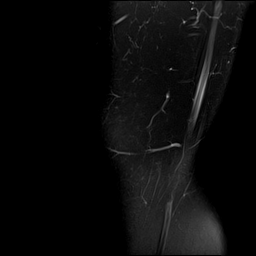
[im 12/36]
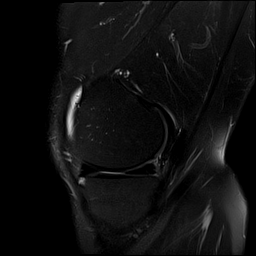
[im 18/36]
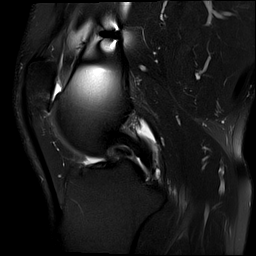
[im 24/36]
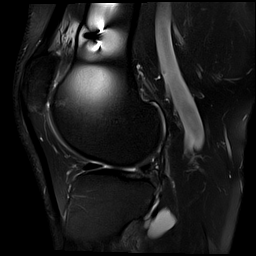
[im 30/36]
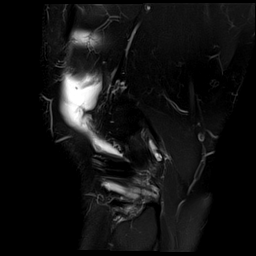
[im 36/36]
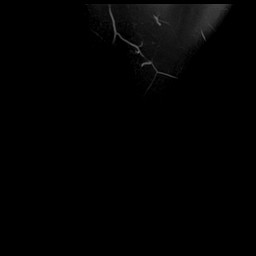

[Series 14: PD · coronal · right · 2.0mm · 0.47mm/px · 3 of 16 slices shown]
[im 1/16]
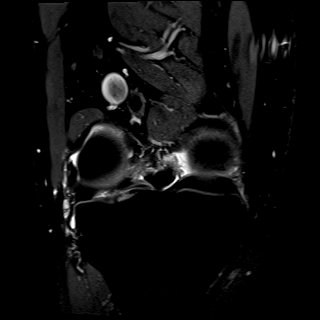
[im 8/16]
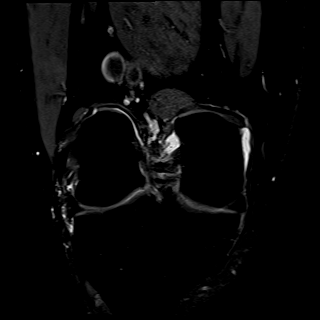
[im 16/16]
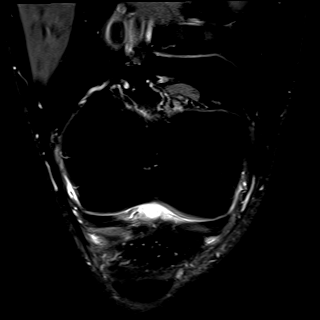

[40 of 40 positions shown; findings below may reference images not displayed]

FINDINGS: MENISCI

Medial meniscus: Mild intrasubstance degeneration with subtle
undersurface irregularity in the region of the posterior horn-body
junction suspicious for developing tear (series 10, images 24-25).

Lateral meniscus: Blunting of the free edge of the lateral meniscal
posterior horn (series 10, images 10-12) with slight extrusion of
the meniscal body.

LIGAMENTS

Cruciates: Intact ACL and PCL.

Collaterals: Intact MCL. Lateral collateral ligament complex intact.

CARTILAGE

Patellofemoral: Mild-moderate chondral thinning and surface
irregularity of the lateral patellar facet.

Medial: Minimal chondral thinning of the weight-bearing medial
compartment.

Lateral:  No chondral defect.

MISCELLANEOUS

Joint: Small joint effusion. Fluid distension of the subpopliteal
recess. Fat pads within normal limits.

Popliteal Fossa:  No Baker's cyst. Intact popliteus tendon.

Extensor Mechanism:  Intact quadriceps and patellar tendons.

Bones: No acute fracture. No dislocation. Mild subchondral marrow
edema within the lateral patella. Partially visualized distal
femoral ORIF hardware with associated susceptibility artifact. No
marrow replacing bone lesion.

Other: No significant periarticular soft tissue findings.
IMPRESSION: 1. Blunting of the free edge of the lateral meniscal posterior horn
with slight extrusion of the meniscal body.
2. Mild intrasubstance degeneration with subtle undersurface
irregularity in the region of the posterior horn-body junction
suspicious for developing tear.
3. Mild-moderate patellofemoral osteoarthritis.
4. Small joint effusion.

## 2024-02-21 ENCOUNTER — Other Ambulatory Visit: Payer: 59

## 2024-02-21 DIAGNOSIS — E7800 Pure hypercholesterolemia, unspecified: Secondary | ICD-10-CM

## 2024-02-21 DIAGNOSIS — R7309 Other abnormal glucose: Secondary | ICD-10-CM

## 2024-02-21 DIAGNOSIS — Z0000 Encounter for general adult medical examination without abnormal findings: Secondary | ICD-10-CM

## 2024-02-21 DIAGNOSIS — N401 Enlarged prostate with lower urinary tract symptoms: Secondary | ICD-10-CM

## 2024-02-21 DIAGNOSIS — E538 Deficiency of other specified B group vitamins: Secondary | ICD-10-CM

## 2024-02-22 LAB — CBC WITH DIFFERENTIAL/PLATELET
Absolute Lymphocytes: 1224 {cells}/uL (ref 850–3900)
Absolute Monocytes: 492 {cells}/uL (ref 200–950)
Basophils Absolute: 32 {cells}/uL (ref 0–200)
Basophils Relative: 0.7 %
Eosinophils Absolute: 271 {cells}/uL (ref 15–500)
Eosinophils Relative: 5.9 %
HCT: 43.9 % (ref 38.5–50.0)
Hemoglobin: 14.1 g/dL (ref 13.2–17.1)
MCH: 28.5 pg (ref 27.0–33.0)
MCHC: 32.1 g/dL (ref 32.0–36.0)
MCV: 88.7 fL (ref 80.0–100.0)
MPV: 10.9 fL (ref 7.5–12.5)
Monocytes Relative: 10.7 %
Neutro Abs: 2581 {cells}/uL (ref 1500–7800)
Neutrophils Relative %: 56.1 %
Platelets: 213 10*3/uL (ref 140–400)
RBC: 4.95 10*6/uL (ref 4.20–5.80)
RDW: 12.4 % (ref 11.0–15.0)
Total Lymphocyte: 26.6 %
WBC: 4.6 10*3/uL (ref 3.8–10.8)

## 2024-02-22 LAB — LIPID PANEL
Cholesterol: 142 mg/dL (ref <200)
HDL: 42 mg/dL (ref >=40)
LDL Cholesterol (Calc): 85 mg/dL
Non-HDL Cholesterol (Calc): 100 mg/dL (ref <130)
Total CHOL/HDL Ratio: 3.4 (calc) (ref <5.0)
Triglycerides: 66 mg/dL (ref <150)

## 2024-02-22 LAB — COMPLETE METABOLIC PANEL WITH GFR
AG Ratio: 2 (calc) (ref 1.0–2.5)
ALT: 31 U/L (ref 9–46)
AST: 27 U/L (ref 10–35)
Albumin: 4.1 g/dL (ref 3.6–5.1)
Alkaline phosphatase (APISO): 53 U/L (ref 35–144)
BUN: 17 mg/dL (ref 7–25)
CO2: 31 mmol/L (ref 20–32)
Calcium: 8.9 mg/dL (ref 8.6–10.3)
Chloride: 105 mmol/L (ref 98–110)
Creat: 1.03 mg/dL (ref 0.70–1.30)
Globulin: 2.1 g/dL (ref 1.9–3.7)
Glucose, Bld: 98 mg/dL (ref 65–99)
Potassium: 4.8 mmol/L (ref 3.5–5.3)
Sodium: 140 mmol/L (ref 135–146)
Total Bilirubin: 0.4 mg/dL (ref 0.2–1.2)
Total Protein: 6.2 g/dL (ref 6.1–8.1)
eGFR: 86 mL/min/{1.73_m2} (ref >=60)

## 2024-02-22 LAB — VITAMIN B12: Vitamin B-12: 324 pg/mL (ref 200–1100)

## 2024-02-22 LAB — HEMOGLOBIN A1C
Hgb A1c MFr Bld: 5.4 %{Hb} (ref <5.7)
Mean Plasma Glucose: 108 mg/dL
eAG (mmol/L): 6 mmol/L

## 2024-02-22 LAB — PSA: PSA: 0.27 ng/mL (ref <=4.00)

## 2024-02-22 LAB — TSH: TSH: 2.42 m[IU]/L (ref 0.40–4.50)

## 2024-02-28 ENCOUNTER — Other Ambulatory Visit: Payer: Self-pay | Admitting: Family Medicine

## 2024-02-28 ENCOUNTER — Ambulatory Visit: Payer: BC Managed Care – PPO | Admitting: Family Medicine

## 2024-02-28 ENCOUNTER — Encounter: Payer: Self-pay | Admitting: Family Medicine

## 2024-02-28 VITALS — BP 110/72 | HR 69 | Ht 72.0 in | Wt 250.6 lb

## 2024-02-28 DIAGNOSIS — N401 Enlarged prostate with lower urinary tract symptoms: Secondary | ICD-10-CM

## 2024-02-28 DIAGNOSIS — N529 Male erectile dysfunction, unspecified: Secondary | ICD-10-CM | POA: Diagnosis not present

## 2024-02-28 DIAGNOSIS — E538 Deficiency of other specified B group vitamins: Secondary | ICD-10-CM | POA: Diagnosis not present

## 2024-02-28 DIAGNOSIS — Z0000 Encounter for general adult medical examination without abnormal findings: Secondary | ICD-10-CM

## 2024-02-28 DIAGNOSIS — E66811 Obesity, class 1: Secondary | ICD-10-CM | POA: Diagnosis not present

## 2024-02-28 DIAGNOSIS — R7309 Other abnormal glucose: Secondary | ICD-10-CM

## 2024-02-28 DIAGNOSIS — E7800 Pure hypercholesterolemia, unspecified: Secondary | ICD-10-CM

## 2024-02-28 MED ORDER — CYANOCOBALAMIN 1000 MCG/ML IJ SOLN: 1000.0000 ug | INTRAMUSCULAR | 0 refills | Status: DC

## 2024-02-28 MED ORDER — SILDENAFIL CITRATE 50 MG PO TABS: 50.0000 mg | ORAL_TABLET | Freq: Every day | ORAL | 5 refills | Status: DC | PRN

## 2024-02-28 MED ORDER — CONTRAVE 8-90 MG PO TB12: ORAL_TABLET | ORAL | 0 refills | Status: AC

## 2024-02-28 NOTE — Patient Instructions (Addendum)
 Thank you for coming to the office today.  B12 injections weekly for 4 weeks Then contact back and I can order monthly for 4 months.  IM intramuscular injection 1mL standard dosing, purchase needle/syringe at pharmacy or online.  Saw Palmetto  Colon Cancer Screening: - For all adults age 56+ routine colon cancer screening is highly recommended.     - Recent guidelines from American Cancer Society recommend starting age of 41 - Early detection of colon cancer is important, because often there are no warning signs or symptoms, also if found early usually it can be cured. Late stage is hard to treat.  - If you are not interested in Colonoscopy screening (if done and normal you could be cleared for 5 to 10 years until next due), then Cologuard is an excellent alternative for screening test for Colon Cancer. It is highly sensitive for detecting DNA of colon cancer from even the earliest stages. Also, there is NO bowel prep required. - If Cologuard is NEGATIVE, then it is good for 3 years before next due - If Cologuard is POSITIVE, then it is strongly advised to get a Colonoscopy, which allows the GI doctor to locate the source of the cancer or polyp (even very early stage) and treat it by removing it. ------------------------- If you would like to proceed with Cologuard (stool DNA test) - FIRST, call your insurance company and tell them you want to check cost of Cologuard tell them CPT Code 08657 (it may be completely covered and you could get for no cost, OR max cost without any coverage is about $600). Also, keep in mind if you do NOT open the kit, and decide not to do the test, you will NOT be charged, you should contact the company if you decide not to do the test. - If you want to proceed, you can notify us (phone message, MyChart Message, or at next visit) and we will order it for you. The test kit will be delivered to you house within about 1 week. Follow instructions to collect sample, you may  call the company for any help or questions, 24/7 telephone support at [phone].    You have been referred for a Coronary Calcium Score Cardiac CT Scan. This is a screening test for patients aged 66-50+ with cardiovascular risk factors or who are healthy but would be interested in Cardiovascular Screening for heart disease. Even if there is a family history of heart disease, this imaging can be useful. Typically it can be done every 5+ years or at a different timeline we agree on  The scan will look at the chest and mainly focus on the heart and identify early signs of calcium build up or blockages within the heart arteries. It is not 100% accurate for identifying blockages or heart disease, but it is useful to help [place] predict who may have some early changes or be at risk in the future for a heart attack or cardiovascular problem.  The results are reviewed by a Cardiologist and they will document the results. It should become available on MyChart. Typically the results are divided into percentiles based on other patients of the same demographic and age. So it will compare your risk to others similar to you. If you have a higher score >99 or higher percentile >75%tile, it is recommended to consider Statin cholesterol therapy and or referral to Cardiologist. I will try to help explain your results and if we have questions we can contact the Cardiologist.  You will be contacted for scheduling. Usually it is done at any imaging facility through [facility], [facility] or [facility].  The cost is $99 flat fee total and it does not go through insurance, so no authorization is required.   Please schedule a Follow-up Appointment to: No follow-ups on file.  If you have any other questions or concerns, please feel free to call the office or send a message through MyChart. You may also schedule an earlier appointment if necessary.  Additionally, you may be receiving a  survey about your experience at our office within a few days to 1 week by e-mail or mail. We value your feedback.  Saralyn Pilar, DO [facility] [place]

## 2024-02-28 NOTE — Telephone Encounter (Signed)
 Requested medications are due for refill today.  no  Requested medications are on the active medications list.  yes  Last refill. 02/28/2024 4mL 0 rf  Future visit scheduled.   In 1 year  Notes to clinic.  Pt is requesting a 90 day supply.    Requested Prescriptions  Pending Prescriptions Disp Refills   cyanocobalamin (VITAMIN B12) 1000 MCG/ML injection [Pharmacy Med Name: CYANOCOBALAMIN 1000MCG/ML INJ, 1ML] 12 mL     Sig: ADMINISTER 1 ML(1000 MCG) IN THE MUSCLE 1 TIME A WEEK FOR 4 WEEKS     Endocrinology:  Vitamins - Vitamin B12 Failed - 02/28/2024  3:34 PM      Failed - Valid encounter within last 12 months    Recent Outpatient Visits           12 months ago Annual physical exam   [place] [facility] Smitty Cords, DO   2 years ago Annual physical exam   [place] [facility] Smitty Cords, DO   2 years ago Chronic pain of right knee   [place] [facility] Smitty Cords, DO       Future Appointments             In 1 year Althea Charon, Netta Neat, DO  [facility]            Passed - HCT in normal range and within 360 days    HCT  Date Value Ref Range Status  02/21/2024 43.9 38.5 - 50.0 % Final         Passed - HGB in normal range and within 360 days    Hemoglobin  Date Value Ref Range Status  02/21/2024 14.1 13.2 - 17.1 g/dL Final         Passed - B12 Level in normal range and within 360 days    Vitamin B-12  Date Value Ref Range Status  02/21/2024 324 200 - 1,100 pg/mL Final    Comment:    . Please Note: Although the reference range for vitamin B12 is [phone] pg/mL, it has been reported that between 5 and 10% of patients with values between 200 and 400 pg/mL may experience neuropsychiatric and hematologic abnormalities due to occult B12 deficiency; less than 1% of patients with values above 400 pg/mL will have symptoms. Marland Kitchen

## 2024-02-28 NOTE — Progress Notes (Signed)
 Subjective:    Patient ID: Alec Hughes, male    DOB: 01/16/1966, 58 y.o.   MRN: [id number]  Alec Hughes is a 58 y.o. male presenting on 02/28/2024 for Annual Exam   HPI  Discussed the use of AI scribe software for clinical note transcription with the patient, who gave verbal consent to proceed.  History of Present Illness   He is a 58 year old who presents for a routine follow-up and discussion of various health maintenance issues.      Here for Annual Physical and Lab Review.   GERD Chronic problem He takes Omeprazole 20mg  AS NEEDED Previously with GI in past, he had Endoscopy (stretched esophagus as well he had some food getting stuck) and Colonoscopy 2016. Fam history polyps he did earlier than age 61. Next due in 2026 for colon cancer screening   Obesity BMI >33 Mild Elevated LDL Well controlled Result shows LDL 85, improved He does not take cholesterol medicine. Goal to improve lifestyle and work on wt loss He has been taking Contrave for weight management but experienced nausea at higher doses. He has restarted it at a lower dose and has noticed a slight weight loss.   Chronic R Knee meniscus issues Chronic osteoarthritis bilateral He has knee arthritis, causing stiffness and occasional swelling. He underwent knee surgery in the past year and manages symptoms with ibuprofen and turmeric.   History of Alcohol intake in past. Now reduced.   History of B12 Deficiency He has a history of vitamin B12 deficiency and has not been taking supplements recently. His vitamin B12 levels have shown a steady decline over the past two years. He experiences fatigue, which he attributes to his weight and arthritis. Last lab B12 324, has shown some reduction He feels some fatigue. He stopped B12 vitamin to see if needed. Interested in B12 shots  Melanoma history Managed by Dr Cheree [facility]   BPH LUTS He experiences urinary symptoms, including a weak stream and  prolonged urination Mild improvement on Saw Palmetto in the past, he will resume this again now.   ED Improved on Sildenafil. Needs a new rx.    Health Maintenance:   Shingrix at pharmacy when ready   Colonoscopy last done 09/08/15 Dr Bluford Kaufmann, repeat in 10 years, or next due in 2026.   PSA 0.27 (negative, previously 0.25)      02/28/2024    8:08 AM 02/26/2022    4:01 PM 02/06/2022   10:18 AM  Depression screen PHQ 2/9  Decreased Interest 0 1 0  Down, Depressed, Hopeless 0 0 0  PHQ - 2 Score 0 1 0  Altered sleeping  1 0  Tired, decreased energy  2 0  Change in appetite  2 0  Feeling bad or failure about yourself   0 0  Trouble concentrating  0 0  Moving slowly or fidgety/restless  0 0  Suicidal thoughts  0 0  PHQ-9 Score  6 0  Difficult doing work/chores  Not difficult at all Not difficult at all       02/28/2024    8:08 AM 02/26/2022    4:01 PM 02/06/2022   10:18 AM  GAD 7 : Generalized Anxiety Score  Nervous, Anxious, on Edge 0 0 0  Control/stop worrying 0 0 0  Worry too much - different things 0 0 0  Trouble relaxing 0 0 0  Restless 0 0 0  Easily annoyed or irritable 0 0 0  Afraid - awful  might happen 0 0 0  Total GAD 7 Score 0 0 0  Anxiety Difficulty Not difficult at all Not difficult at all Not difficult at all     Past Medical History:  Diagnosis Date   ADHD (attention deficit hyperactivity disorder)    Anxiety    Attention deficit    Erectile dysfunction of organic origin    a.) on PDE5i (sildenafil)   GERD (gastroesophageal reflux disease)    History of hematuria    Malignant melanoma of skin (HCC)    Plantar fasciitis    Past Surgical History:  Procedure Laterality Date   BACK SURGERY     broken vertebrae     COLONOSCOPY WITH PROPOFOL N/A 09/08/2015   Procedure: COLONOSCOPY WITH PROPOFOL;  Surgeon: Wallace Cullens, MD;  Location: [facility] ENDOSCOPY;  Service: Gastroenterology;  Laterality: N/A;   ESOPHAGOGASTRODUODENOSCOPY (EGD) WITH PROPOFOL N/A 09/08/2015    Procedure: ESOPHAGOGASTRODUODENOSCOPY (EGD) WITH PROPOFOL;  Surgeon: Wallace Cullens, MD;  Location: [facility] ENDOSCOPY;  Service: Gastroenterology;  Laterality: N/A;   FRACTURE SURGERY     KNEE ARTHROSCOPY Right 05/22/2023   Procedure: RIGHT KNEE ARTHROSCOPY WITH ABRASION CHONDROPLASTY, PARTIAL MEDIAL AND/OR LATERAL MENISCECTOMIES, AND REINJECTION OF HARVESTED INFRAPATELLAR FAT CELLS;  Surgeon: Christena Flake, MD;  Location: [facility] ORS;  Service: Orthopedics;  Laterality: Right;   MELANOMA EXCISION     orif left wrist     orif right femur     PLANTAR FASCIA RELEASE Right 11/05/2019   Procedure: ENDOSCOPIC PLANTAR FASC. RELEASE RIGHT;  Surgeon: Recardo Evangelist, DPM;  Location: [facility];  Service: Podiatry;  Laterality: Right;  LMA WITH POP BLOCK   VASECTOMY 09/2017     VENTRAL HERNIA REPAIR N/A 12/08/2020   Procedure: HERNIA REPAIR VENTRAL ADULT;  Surgeon: Sung Amabile, DO;  Location: [facility] ORS;  Service: General;  Laterality: N/A;   XI ROBOTIC ASSISTED VENTRAL HERNIA N/A 10/02/2019   Procedure: XI ROBOTIC ASSISTED VENTRAL HERNIA;  Surgeon: Sung Amabile, DO;  Location: [facility] ORS;  Service: General;  Laterality: N/A;   Social History   Socioeconomic History   Marital status: Married    Spouse name: Inetta Fermo   Number of children: Not on file   Years of education: Not on file   Highest education level: 12th grade  Occupational History   Not on file  Tobacco Use   Smoking status: Former    Current packs/day: 0.00    Types: Cigarettes    Quit date: 09/27/1994    Years since quitting: 29.4   Smokeless tobacco: Never  Vaping Use   Vaping status: Never Used  Substance and Sexual Activity   Alcohol use: Yes    Alcohol/week: 14.0 standard drinks of alcohol    Types: 14 Shots of liquor per week    Comment: 3 x week   Drug use: No   Sexual activity: Not on file  Other Topics Concern   Not on file  Social History Narrative   Not on file   Social Drivers of Health   Financial Resource  Strain: Low Risk  (02/24/2024)   Overall Financial Resource Strain (CARDIA)    Difficulty of Paying Living Expenses: Not hard at all  Food Insecurity: No Food Insecurity (02/24/2024)   Hunger Vital Sign    Worried About Running Out of Food in the Last Year: Never true    Ran Out of Food in the Last Year: Never true  Transportation Needs: No Transportation Needs (02/24/2024)   PRAPARE - Transportation  Lack of Transportation (Medical): No    Lack of Transportation (Non-Medical): No  Physical Activity: Insufficiently Active (02/24/2024)   Exercise Vital Sign    Days of Exercise per Week: 4 days    Minutes of Exercise per Session: 20 min  Stress: No Stress Concern Present (02/24/2024)   [employer] of Occupational Health - Occupational Stress Questionnaire    Feeling of Stress : Only a little  Social Connections: Socially Isolated (02/24/2024)   Social Connection and Isolation Panel [NHANES]    Frequency of Communication with Friends and Family: Once a week    Frequency of Social Gatherings with Friends and Family: Once a week    Attends Religious Services: Never    Database administrator or Organizations: No    Attends Engineer, structural: Not on file    Marital Status: Married  Catering manager Violence: Not on file   Family History  Problem Relation Age of Onset   Prostate cancer Neg Hx    Bladder Cancer Neg Hx    Kidney cancer Neg Hx    Current Outpatient Medications on File Prior to Visit  Medication Sig   ibuprofen (ADVIL) 800 MG tablet Take 1 tablet (800 mg total) by mouth every 8 (eight) hours as needed for mild pain or moderate pain.   omega-3 fish oil (MAXEPA) 1000 MG CAPS capsule Take 1,000 mg by mouth daily.   omeprazole (PRILOSEC) 20 MG capsule Take 1 capsule (20 mg total) by mouth daily before breakfast.   saw palmetto 160 MG capsule Take 160 mg by mouth 2 (two) times daily.   Turmeric (QC TUMERIC COMPLEX PO) Take by mouth.   No current facility-administered  medications on file prior to visit.    Review of Systems  Constitutional:  Negative for activity change, appetite change, chills, diaphoresis, fatigue and fever.  HENT:  Negative for congestion and hearing loss.   Eyes:  Negative for visual disturbance.  Respiratory:  Negative for cough, chest tightness, shortness of breath and wheezing.   Cardiovascular:  Negative for chest pain, palpitations and leg swelling.  Gastrointestinal:  Negative for abdominal pain, constipation, diarrhea, nausea and vomiting.  Genitourinary:  Negative for dysuria, frequency and hematuria.  Musculoskeletal:  Positive for arthralgias and joint swelling. Negative for neck pain.  Skin:  Negative for rash.  Neurological:  Negative for dizziness, weakness, light-headedness, numbness and headaches.  Hematological:  Negative for adenopathy.  Psychiatric/Behavioral:  Negative for behavioral problems, dysphoric mood and sleep disturbance.    Per HPI unless specifically indicated above     Objective:    BP 110/72 (BP Location: Left Arm, Patient Position: Sitting, Cuff Size: Large)   Pulse 69   Ht 6' (1.829 m)   Wt 250 lb 9.6 oz (113.7 kg)   SpO2 95%   BMI 33.99 kg/m   Wt Readings from Last 3 Encounters:  02/28/24 250 lb 9.6 oz (113.7 kg)  05/22/23 247 lb 2.2 oz (112.1 kg)  03/01/23 247 lb (112 kg)    Physical Exam Vitals and nursing note reviewed.  Constitutional:      General: He is not in acute distress.    Appearance: He is well-developed. He is not diaphoretic.     Comments: Well-appearing, comfortable, cooperative  HENT:     Head: Normocephalic and atraumatic.     Right Ear: Tympanic membrane, ear canal and external ear normal. There is no impacted cerumen.     Left Ear: Tympanic membrane, ear canal and external ear  normal. There is no impacted cerumen.  Eyes:     General:        Right eye: No discharge.        Left eye: No discharge.     Conjunctiva/sclera: Conjunctivae normal.     Pupils:  Pupils are equal, round, and reactive to light.  Neck:     Thyroid: No thyromegaly.     Vascular: No carotid bruit.  Cardiovascular:     Rate and Rhythm: Normal rate and regular rhythm.     Pulses: Normal pulses.     Heart sounds: Normal heart sounds. No murmur heard. Pulmonary:     Effort: Pulmonary effort is normal. No respiratory distress.     Breath sounds: Normal breath sounds. No wheezing or rales.  Abdominal:     General: Bowel sounds are normal. There is no distension.     Palpations: Abdomen is soft. There is no mass.     Tenderness: There is no abdominal tenderness.  Musculoskeletal:        General: No tenderness. Normal range of motion.     Cervical back: Normal range of motion and neck supple.     Right lower leg: No edema.     Left lower leg: No edema.     Comments: Upper / Lower Extremities: - Normal muscle tone, strength bilateral upper extremities 5/5, lower extremities 5/5  Lymphadenopathy:     Cervical: No cervical adenopathy.  Skin:    General: Skin is warm and dry.     Findings: No erythema or rash.  Neurological:     Mental Status: He is alert and oriented to person, place, and time.     Comments: Distal sensation intact to light touch all extremities  Psychiatric:        Mood and Affect: Mood normal.        Behavior: Behavior normal.        Thought Content: Thought content normal.     Comments: Well groomed, good eye contact, normal speech and thoughts     Results for orders placed or performed in visit on 02/21/24  Vitamin B12   Collection Time: 02/21/24  8:01 AM  Result Value Ref Range   Vitamin B-12 324 200 - 1,100 pg/mL  TSH   Collection Time: 02/21/24  8:01 AM  Result Value Ref Range   TSH 2.42 0.40 - 4.50 mIU/L  PSA   Collection Time: 02/21/24  8:01 AM  Result Value Ref Range   PSA 0.27 < OR = 4.00 ng/mL  Hemoglobin A1c   Collection Time: 02/21/24  8:01 AM  Result Value Ref Range   Hgb A1c MFr Bld 5.4 <5.7 % of total Hgb   Mean Plasma  Glucose 108 mg/dL   eAG (mmol/L) 6.0 mmol/L  Lipid panel   Collection Time: 02/21/24  8:01 AM  Result Value Ref Range   Cholesterol 142 <200 mg/dL   HDL 42 > OR = 40 mg/dL   Triglycerides 66 <604 mg/dL   LDL Cholesterol (Calc) 85 mg/dL (calc)   Total CHOL/HDL Ratio 3.4 <5.0 (calc)   Non-HDL Cholesterol (Calc) 100 <130 mg/dL (calc)  CBC with Differential/Platelet   Collection Time: 02/21/24  8:01 AM  Result Value Ref Range   WBC 4.6 3.8 - 10.8 Thousand/uL   RBC 4.95 4.20 - 5.80 Million/uL   Hemoglobin 14.1 13.2 - 17.1 g/dL   HCT 54.0 98.1 - 19.1 %   MCV 88.7 80.0 - 100.0 fL   MCH 28.5 27.0 -  33.0 pg   MCHC 32.1 32.0 - 36.0 g/dL   RDW 16.1 09.6 - 04.5 %   Platelets 213 140 - 400 Thousand/uL   MPV 10.9 7.5 - 12.5 fL   Neutro Abs 2,581 1,500 - 7,800 cells/uL   Absolute Lymphocytes 1,224 850 - 3,900 cells/uL   Absolute Monocytes 492 200 - 950 cells/uL   Eosinophils Absolute 271 15 - 500 cells/uL   Basophils Absolute 32 0 - 200 cells/uL   Neutrophils Relative % 56.1 %   Total Lymphocyte 26.6 %   Monocytes Relative 10.7 %   Eosinophils Relative 5.9 %   Basophils Relative 0.7 %  COMPLETE METABOLIC PANEL WITH GFR   Collection Time: 02/21/24  8:01 AM  Result Value Ref Range   Glucose, Bld 98 65 - 99 mg/dL   BUN 17 7 - 25 mg/dL   Creat 4.09 8.11 - 9.14 mg/dL   eGFR 86 > OR = 60 NW/GNF/6.21H0   BUN/Creatinine Ratio SEE NOTE: 6 - 22 (calc)   Sodium 140 135 - 146 mmol/L   Potassium 4.8 3.5 - 5.3 mmol/L   Chloride 105 98 - 110 mmol/L   CO2 31 20 - 32 mmol/L   Calcium 8.9 8.6 - 10.3 mg/dL   Total Protein 6.2 6.1 - 8.1 g/dL   Albumin 4.1 3.6 - 5.1 g/dL   Globulin 2.1 1.9 - 3.7 g/dL (calc)   AG Ratio 2.0 1.0 - 2.5 (calc)   Total Bilirubin 0.4 0.2 - 1.2 mg/dL   Alkaline phosphatase (APISO) 53 35 - 144 U/L   AST 27 10 - 35 U/L   ALT 31 9 - 46 U/L      Assessment & Plan:   Problem List Items Addressed This Visit     Erectile dysfunction of organic origin   Relevant  Medications   sildenafil (VIAGRA) 50 MG tablet   Obesity (BMI 30.0-34.9)   Relevant Medications   CONTRAVE 8-90 MG TB12   Other Visit Diagnoses       Annual physical exam    -  Primary     Vitamin B12 deficiency       Relevant Medications   cyanocobalamin (VITAMIN B12) 1000 MCG/ML injection     Elevated LDL cholesterol level       Relevant Orders   CT CARDIAC SCORING (SELF PAY ONLY)        Updated Health Maintenance information Reviewed recent lab results with patient Encouraged improvement to lifestyle with diet and exercise Goal of weight loss   Vitamin B12 Deficiency Noted a steady decline in B12 levels over the past two years. Patient reports chronic fatigue. B12 >1000 then 600s now 300s -Order B12 injections, weekly for four weeks.Marland Kitchen He will admin at home, his wife is a Publishing rights manager. -After four weeks, reassess and potentially order monthly for four months  Obesity BMI >33 Weight Management Patient is overweight and has been taking Contrave inconsistently due to nausea. -Continue Contrave, gradually increasing the dose as tolerated. Re order.  Benign Prostatic Hyperplasia BPH Patient reports decreased urinary stream and difficulty emptying bladder. -Resume Saw Palmetto supplement.  Osteoarthritis, multiple joints Patient reports chronic knee pain and stiffness. -Continue current management plan with ibuprofen and turmeric as recommended by Dr. Joice Lofts  General Health Maintenance -Order coronary artery calcium score to assess for potential blockages in the heart. -Consider Cologuard for colon cancer screening. When eligible in 08/2025 for Colon CA Screening as last Colonoscopy was negative without polyps for 10 years -Schedule follow-up  appointment for March 2026.         Orders Placed This Encounter  Procedures   CT CARDIAC SCORING (SELF PAY ONLY)    Standing Status:   Future    Expiration Date:   02/27/2025    Preferred imaging location?:   [place]  [facility]    Meds ordered this encounter  Medications   cyanocobalamin (VITAMIN B12) 1000 MCG/ML injection    Sig: Inject 1 mL (1,000 mcg total) into the muscle once a week. For 4 weeks    Dispense:  4 mL    Refill:  0   CONTRAVE 8-90 MG TB12    Sig: Week 1: Take 1 tab daily with breakfast. Week 2: Take 1 tab twice daily with meal. Week 3: Take 2 tab with AM meal and 1 tab with PM meal. Week 4+: Take 2 tabs twice daily with meals - continue for weight loss    Dispense:  120 tablet    Refill:  0   sildenafil (VIAGRA) 50 MG tablet    Sig: Take 1 tablet (50 mg total) by mouth daily as needed for erectile dysfunction.    Dispense:  90 tablet    Refill:  5     Follow up plan: Return for 1 year fasting lab > 1 week later Annual Physical.  Future labs 02/2025  Saralyn Pilar, DO [facility] 02/28/2024, 8:09 AM

## 2024-03-06 ENCOUNTER — Ambulatory Visit
Admission: RE | Admit: 2024-03-06 | Discharge: 2024-03-06 | Disposition: A | Discharge status: Home / Self Care | Payer: Self-pay | Source: Ambulatory Visit | Attending: Family Medicine | Admitting: Family Medicine

## 2024-03-06 ENCOUNTER — Encounter: Payer: Self-pay | Admitting: Family Medicine

## 2024-03-06 DIAGNOSIS — E7800 Pure hypercholesterolemia, unspecified: Secondary | ICD-10-CM | POA: Insufficient documentation

## 2024-03-31 ENCOUNTER — Encounter: Payer: Self-pay | Admitting: Family Medicine

## 2024-03-31 DIAGNOSIS — E538 Deficiency of other specified B group vitamins: Secondary | ICD-10-CM

## 2024-04-01 MED ORDER — CYANOCOBALAMIN 1000 MCG/ML IJ SOLN: 1000.0000 ug | INTRAMUSCULAR | 0 refills | Status: AC

## 2024-08-25 ENCOUNTER — Encounter: Payer: Self-pay | Admitting: Family Medicine

## 2024-09-01 ENCOUNTER — Encounter: Payer: Self-pay | Admitting: Family Medicine

## 2024-09-01 ENCOUNTER — Ambulatory Visit: Admitting: Family Medicine

## 2024-09-01 VITALS — BP 112/70 | HR 65 | Ht 72.0 in | Wt 246.2 lb

## 2024-09-01 DIAGNOSIS — N529 Male erectile dysfunction, unspecified: Secondary | ICD-10-CM

## 2024-09-01 DIAGNOSIS — N401 Enlarged prostate with lower urinary tract symptoms: Secondary | ICD-10-CM | POA: Diagnosis not present

## 2024-09-01 DIAGNOSIS — R3912 Poor urinary stream: Secondary | ICD-10-CM | POA: Diagnosis not present

## 2024-09-01 DIAGNOSIS — E291 Testicular hypofunction: Secondary | ICD-10-CM

## 2024-09-01 LAB — TESTOSTERONE: Testosterone: 583 ng/dL (ref 250–827)

## 2024-09-01 NOTE — Progress Notes (Signed)
 Subjective:    Patient ID: Alec Hughes, male    DOB: Aug 09, 1966, 58 y.o.   MRN: [id number]  Alec Hughes is a 58 y.o. male presenting on 09/01/2024 for Benign Prostatic Hypertrophy and Erectile Dysfunction   HPI  Discussed the use of AI scribe software for clinical note transcription with the patient, who gave verbal consent to proceed.  History of Present Illness   Alec Hughes is a 58 year old male with benign prostatic hyperplasia who presents with worsening urinary symptoms and erectile dysfunction.  BPH Lower urinary tract symptoms - Persistent urinary flow issues despite use of saw palmetto  twice daily since March - Worsening urinary flow over the past six months, now described as a 'dribble' - No improvement in symptoms with current treatments - Not interested in other rx alpha blocker today, he will consider options, but now requested for consult with Urologist - History of urological evaluation for hematuria, determined to be benign  Erectile Dysfunction / Sexual Dysfunction - Decreased intensity of ejaculation and reduced stamina over the last one to two years - Sildenafil  50 mg previously effective for five to seven years, now less effective over the past six months - No recent testosterone  testing; last checked approximately ten years ago and was normal         09/01/2024    9:25 AM 02/28/2024    8:08 AM 02/26/2022    4:01 PM  Depression screen PHQ 2/9  Decreased Interest 0 0 1  Down, Depressed, Hopeless 0 0 0  PHQ - 2 Score 0 0 1  Altered sleeping 0  1  Tired, decreased energy 2  2  Change in appetite 0  2  Feeling bad or failure about yourself  0  0  Trouble concentrating 0  0  Moving slowly or fidgety/restless 0  0  Suicidal thoughts 0  0  PHQ-9 Score 2  6  Difficult doing work/chores Not difficult at all  Not difficult at all       09/01/2024    9:25 AM 02/28/2024    8:08 AM 02/26/2022    4:01 PM 02/06/2022   10:18 AM  GAD 7 :  Generalized Anxiety Score  Nervous, Anxious, on Edge 0 0 0 0  Control/stop worrying 0 0 0 0  Worry too much - different things 0 0 0 0  Trouble relaxing 0 0 0 0  Restless 0 0 0 0  Easily annoyed or irritable 0 0 0 0  Afraid - awful might happen 0 0 0 0  Total GAD 7 Score 0 0 0 0  Anxiety Difficulty  Not difficult at all Not difficult at all Not difficult at all    Social History   Tobacco Use   Smoking status: Former    Current packs/day: 0.00    Types: Cigarettes    Quit date: 09/27/1994    Years since quitting: 29.9   Smokeless tobacco: Never  Vaping Use   Vaping status: Never Used  Substance Use Topics   Alcohol use: Yes    Alcohol/week: 14.0 standard drinks of alcohol    Types: 14 Shots of liquor per week    Comment: 3 x week   Drug use: No    Review of Systems Per HPI unless specifically indicated above     Objective:    BP 112/70 (BP Location: Left Arm, Patient Position: Sitting, Cuff Size: Large)   Pulse 65   Ht 6' (1.829 m)  Wt 246 lb 4 oz (111.7 kg)   SpO2 95%   BMI 33.40 kg/m   Wt Readings from Last 3 Encounters:  09/01/24 246 lb 4 oz (111.7 kg)  02/28/24 250 lb 9.6 oz (113.7 kg)  05/22/23 247 lb 2.2 oz (112.1 kg)    Physical Exam Vitals and nursing note reviewed.  Constitutional:      General: He is not in acute distress.    Appearance: Normal appearance. He is well-developed. He is not diaphoretic.     Comments: Well-appearing, comfortable, cooperative  HENT:     Head: Normocephalic and atraumatic.  Eyes:     General:        Right eye: No discharge.        Left eye: No discharge.     Conjunctiva/sclera: Conjunctivae normal.  Cardiovascular:     Rate and Rhythm: Normal rate.  Pulmonary:     Effort: Pulmonary effort is normal.  Skin:    General: Skin is warm and dry.     Findings: No erythema or rash.  Neurological:     Mental Status: He is alert and oriented to person, place, and time.  Psychiatric:        Mood and Affect: Mood  normal.        Behavior: Behavior normal.        Thought Content: Thought content normal.     Comments: Well groomed, good eye contact, normal speech and thoughts     Results for orders placed or performed in visit on 02/21/24  Vitamin B12   Collection Time: 02/21/24  8:01 AM  Result Value Ref Range   Vitamin B-12 324 200 - 1,100 pg/mL  TSH   Collection Time: 02/21/24  8:01 AM  Result Value Ref Range   TSH 2.42 0.40 - 4.50 mIU/L  PSA   Collection Time: 02/21/24  8:01 AM  Result Value Ref Range   PSA 0.27 < OR = 4.00 ng/mL  Hemoglobin A1c   Collection Time: 02/21/24  8:01 AM  Result Value Ref Range   Hgb A1c MFr Bld 5.4 <5.7 % of total Hgb   Mean Plasma Glucose 108 mg/dL   eAG (mmol/L) 6.0 mmol/L  Lipid panel   Collection Time: 02/21/24  8:01 AM  Result Value Ref Range   Cholesterol 142 <200 mg/dL   HDL 42 > OR = 40 mg/dL   Triglycerides 66 <849 mg/dL   LDL Cholesterol (Calc) 85 mg/dL (calc)   Total CHOL/HDL Ratio 3.4 <5.0 (calc)   Non-HDL Cholesterol (Calc) 100 <130 mg/dL (calc)  CBC with Differential/Platelet   Collection Time: 02/21/24  8:01 AM  Result Value Ref Range   WBC 4.6 3.8 - 10.8 Thousand/uL   RBC 4.95 4.20 - 5.80 Million/uL   Hemoglobin 14.1 13.2 - 17.1 g/dL   HCT 56.0 61.4 - 49.9 %   MCV 88.7 80.0 - 100.0 fL   MCH 28.5 27.0 - 33.0 pg   MCHC 32.1 32.0 - 36.0 g/dL   RDW 87.5 88.9 - 84.9 %   Platelets 213 140 - 400 Thousand/uL   MPV 10.9 7.5 - 12.5 fL   Neutro Abs 2,581 1,500 - 7,800 cells/uL   Absolute Lymphocytes 1,224 850 - 3,900 cells/uL   Absolute Monocytes 492 200 - 950 cells/uL   Eosinophils Absolute 271 15 - 500 cells/uL   Basophils Absolute 32 0 - 200 cells/uL   Neutrophils Relative % 56.1 %   Total Lymphocyte 26.6 %   Monocytes Relative 10.7 %  Eosinophils Relative 5.9 %   Basophils Relative 0.7 %  COMPLETE METABOLIC PANEL WITH GFR   Collection Time: 02/21/24  8:01 AM  Result Value Ref Range   Glucose, Bld 98 65 - 99 mg/dL   BUN 17 7  - 25 mg/dL   Creat 8.96 9.29 - 8.69 mg/dL   eGFR 86 > OR = 60 fO/fpw/8.26f7   BUN/Creatinine Ratio SEE NOTE: 6 - 22 (calc)   Sodium 140 135 - 146 mmol/L   Potassium 4.8 3.5 - 5.3 mmol/L   Chloride 105 98 - 110 mmol/L   CO2 31 20 - 32 mmol/L   Calcium 8.9 8.6 - 10.3 mg/dL   Total Protein 6.2 6.1 - 8.1 g/dL   Albumin 4.1 3.6 - 5.1 g/dL   Globulin 2.1 1.9 - 3.7 g/dL (calc)   AG Ratio 2.0 1.0 - 2.5 (calc)   Total Bilirubin 0.4 0.2 - 1.2 mg/dL   Alkaline phosphatase (APISO) 53 35 - 144 U/L   AST 27 10 - 35 U/L   ALT 31 9 - 46 U/L      Assessment & Plan:   Problem List Items Addressed This Visit     Erectile dysfunction of organic origin   Relevant Orders   Ambulatory referral to Urology   Other Visit Diagnoses       Benign prostatic hyperplasia with weak urinary stream    -  Primary   Relevant Orders   Ambulatory referral to Urology     Hypogonadism in male       Relevant Orders   Testosterone         Benign prostatic hyperplasia with lower urinary tract symptoms Chronic urinary symptoms not improved with saw palmetto , worsening over the past year. Declined options with alpha blocker at this time, he prefers to escalate to Urology consultation - Refer to urology for further evaluation and management.  Erectile dysfunction Chronic problem for years. Now 6 months Worsening erectile dysfunction with decreased sildenafil  efficacy - Increase sildenafil  to 100 mg (from 50mg ) - Refer to urology for further evaluation and management. - Order testosterone  level.        Orders Placed This Encounter  Procedures   Testosterone    Ambulatory referral to Urology    Referral Priority:   Routine    Referral Type:   Consultation    Referral Reason:   Specialty Services Required    Requested Specialty:   Urology    Number of Visits Requested:   1    No orders of the defined types were placed in this encounter.   Follow up plan: Return if symptoms worsen or fail to  improve.   Marsa Officer, DO [facility] 09/01/2024, 9:03 AM

## 2024-09-01 NOTE — Patient Instructions (Addendum)
 Thank you for coming to the office today.  We will check Testosterone  today.  Double dose of Sildnenafil 50mg  x 2 = 100mg  for now.  Future consider other meds for prostate.  Referral for BPH and ED  [facility] -1st floor [address] [place],  [place]  [address] Phone: [phone]  Please schedule a Follow-up Appointment to: Return if symptoms worsen or fail to improve.  If you have any other questions or concerns, please feel free to call the office or send a message through MyChart. You may also schedule an earlier appointment if necessary.  Additionally, you may be receiving a survey about your experience at our office within a few days to 1 week by e-mail or mail. We value your feedback.  Marsa Officer, DO [facility], [place]

## 2024-09-03 ENCOUNTER — Ambulatory Visit: Payer: Self-pay | Admitting: Family Medicine

## 2024-09-29 ENCOUNTER — Other Ambulatory Visit: Payer: Self-pay | Admitting: Urology

## 2024-09-29 ENCOUNTER — Ambulatory Visit: Admitting: Urology

## 2024-09-29 ENCOUNTER — Encounter: Payer: Self-pay | Admitting: Urology

## 2024-09-29 VITALS — BP 124/78 | HR 71 | Ht 72.0 in | Wt 242.0 lb

## 2024-09-29 DIAGNOSIS — R3912 Poor urinary stream: Secondary | ICD-10-CM

## 2024-09-29 DIAGNOSIS — N401 Enlarged prostate with lower urinary tract symptoms: Secondary | ICD-10-CM | POA: Diagnosis not present

## 2024-09-29 DIAGNOSIS — N529 Male erectile dysfunction, unspecified: Secondary | ICD-10-CM

## 2024-09-29 LAB — URINALYSIS, COMPLETE
Bilirubin, UA: NEGATIVE
Glucose, UA: NEGATIVE
Ketones, UA: NEGATIVE
Leukocytes,UA: NEGATIVE
Nitrite, UA: NEGATIVE
Protein,UA: NEGATIVE
RBC, UA: NEGATIVE
Specific Gravity, UA: 1.02 (ref 1.005–1.030)
Urobilinogen, Ur: 0.2 mg/dL (ref 0.2–1.0)
pH, UA: 6 (ref 5.0–7.5)

## 2024-09-29 LAB — MICROSCOPIC EXAMINATION: Bacteria, UA: NONE SEEN

## 2024-09-29 LAB — BLADDER SCAN AMB NON-IMAGING

## 2024-09-29 MED ORDER — TADALAFIL 5 MG PO TABS: 5.0000 mg | ORAL_TABLET | Freq: Every day | ORAL | 1 refills | Status: DC | PRN

## 2024-09-29 NOTE — Progress Notes (Signed)
 09/29/2024 4:35 PM   [name] Alec Hughes 07, 1967 [id number]  Referring provider: Edman Marsa PARAS, DO [address] [place],  [place] [address]  Chief Complaint  Patient presents with   Benign Prostatic Hypertrophy    HPI: Alec Hughes is a 58 y.o. male referred for evaluation of BPH and erectile dysfunction.  First bullet 5-year history of progressively worsening lower urinary tract symptoms including urinary frequency, intermittent urinary stream, weak urinary stream IPSS today 16/35 Presently on saw palmetto  without significant improvement in his lower urinary tract symptoms  8-9 year history of erectile dysfunction; started sildenafil  50 mg as needed which has been effective until 1 year ago when he noted decreased ejaculate and decreased intensity of his ejaculation.  Recent visit with PCP it was recommended increasing to 100 mg however he has not yet titrated In a.m. testosterone  level was checked last month which was normal at 583 ng/dL PSA 7/71/7974 stable at 0.27   PMH: Past Medical History:  Diagnosis Date   ADHD (attention deficit hyperactivity disorder)    Anxiety    Attention deficit    Erectile dysfunction of organic origin    a.) on PDE5i (sildenafil )   GERD (gastroesophageal reflux disease)    History of hematuria    Malignant melanoma of skin (HCC)    Plantar fasciitis     Surgical History: Past Surgical History:  Procedure Laterality Date   BACK SURGERY     broken vertebrae     COLONOSCOPY WITH PROPOFOL  N/A 09/08/2015   Procedure: COLONOSCOPY WITH PROPOFOL ;  Surgeon: Deward CINDERELLA Piedmont, MD;  Location: [facility];  Service: Gastroenterology;  Laterality: N/A;   ESOPHAGOGASTRODUODENOSCOPY (EGD) WITH PROPOFOL  N/A 09/08/2015   Procedure: ESOPHAGOGASTRODUODENOSCOPY (EGD) WITH PROPOFOL ;  Surgeon: Deward CINDERELLA Piedmont, MD;  Location: [facility];  Service: Gastroenterology;  Laterality: N/A;   FRACTURE SURGERY     KNEE ARTHROSCOPY Right 05/22/2023    Procedure: RIGHT KNEE ARTHROSCOPY WITH ABRASION CHONDROPLASTY, PARTIAL MEDIAL AND/OR LATERAL MENISCECTOMIES, AND REINJECTION OF HARVESTED INFRAPATELLAR FAT CELLS;  Surgeon: Edie Norleen PARAS, MD;  Location: [facility];  Service: Orthopedics;  Laterality: Right;   MELANOMA EXCISION     orif left wrist     orif right femur     PLANTAR FASCIA RELEASE Right 11/05/2019   Procedure: ENDOSCOPIC PLANTAR FASC. RELEASE RIGHT;  Surgeon: Lilli Cough, DPM;  [facility]: [facility];  Service: Podiatry;  Laterality: Right;  LMA WITH POP BLOCK   VASECTOMY 09/2017     VENTRAL HERNIA REPAIR N/A 12/08/2020   Procedure: HERNIA REPAIR VENTRAL ADULT;  Surgeon: Tye Millet, DO;  Location: [facility];  Service: General;  Laterality: N/A;   XI ROBOTIC ASSISTED VENTRAL HERNIA N/A 10/02/2019   Procedure: XI ROBOTIC ASSISTED VENTRAL HERNIA;  Surgeon: Tye Millet, DO;  Location: [facility];  Service: General;  Laterality: N/A;    Home Medications:  Allergies as of 09/29/2024   No Known Allergies      Medication List        Accurate as of September 29, 2024  4:35 PM. If you have any questions, ask your nurse or doctor.          Contrave  8-90 MG Tb12 Generic drug: Naltrexone-buPROPion HCl ER Week 1: Take 1 tab daily with breakfast. Week 2: Take 1 tab twice daily with meal. Week 3: Take 2 tab with AM meal and 1 tab with PM meal. Week 4+: Take 2 tabs twice daily with meals - continue for weight loss  cyanocobalamin  1000 MCG/ML injection Commonly known as: VITAMIN B12 Inject 1 mL (1,000 mcg total) into the muscle every 30 (thirty) days. For 4 months   ibuprofen  800 MG tablet Commonly known as: ADVIL  Take 1 tablet (800 mg total) by mouth every 8 (eight) hours as needed for mild pain or moderate pain.   omega-3 fish oil 1000 MG Caps capsule Commonly known as: MAXEPA Take 1,000 mg by mouth daily.   omeprazole  20 MG capsule Commonly known as: PRILOSEC Take 1 capsule (20 mg total) by mouth daily before  breakfast.   QC TUMERIC COMPLEX PO Take by mouth.   saw palmetto  160 MG capsule Take 160 mg by mouth 2 (two) times daily.   sildenafil  50 MG tablet Commonly known as: VIAGRA  Take 1 tablet (50 mg total) by mouth daily as needed for erectile dysfunction.   tadalafil 5 MG tablet Commonly known as: CIALIS Take 1 tablet (5 mg total) by mouth daily as needed for erectile dysfunction. Started by: [name]        Allergies: No Known Allergies  Family History: Family History  Problem Relation Age of Onset   Prostate cancer Neg Hx    Bladder Cancer Neg Hx    Kidney cancer Neg Hx     Social History:  reports that he quit smoking about 30 years ago. His smoking use included cigarettes. He has never used smokeless tobacco. He reports current alcohol use of about 14.0 standard drinks of alcohol per week. He reports that he does not use drugs.   Physical Exam: BP 124/78   Pulse 71   Ht 6' (1.829 m)   Wt 242 lb (109.8 kg)   BMI 32.82 kg/m   Constitutional:  Alert, No acute distress. HEENT: [place] AT Respiratory: Normal respiratory effort, no increased work of breathing. GU: Prostate 40 g, smooth without nodules Psychiatric: Normal mood and affect.  Laboratory Data:  Lab Results  Component Value Date   PSA 0.27 02/21/2024   PSA 0.25 02/22/2023   PSA 0.54 02/21/2022    Lab Results  Component Value Date   TESTOSTERONE  583 09/01/2024   Urinalysis Dipstick/microscopy negative   Assessment & Plan:    1.  BPH with LUTS Moderate lower urinary tract symptoms PVR today 29 mL We discussed medical management including alpha-blocker or a PDE 5 inhibitor.  Since he does have concomitant erectile dysfunction a trial of low-dose tadalafil 5 mg daily was discussed.  He can continue to supplement with sildenafil  prn He elected tadalafil trial and Rx sent He was offered a follow-up appointment versus messaging the efficacy of this medication and elected the latter  2.   Erectile dysfunction Tadalafil 5 mg daily as above   [name], MD  [facility] [address] [place], [place] [address] [phone]

## 2024-10-31 ENCOUNTER — Other Ambulatory Visit: Payer: Self-pay | Admitting: Urology

## 2024-12-10 ENCOUNTER — Other Ambulatory Visit: Payer: Self-pay | Admitting: *Deleted

## 2024-12-10 MED ORDER — TADALAFIL 5 MG PO TABS: 5.0000 mg | ORAL_TABLET | Freq: Every day | ORAL | 0 refills | Status: DC | PRN

## 2024-12-10 MED ORDER — TADALAFIL 5 MG PO TABS: 5.0000 mg | ORAL_TABLET | Freq: Every day | ORAL | 1 refills | Status: AC

## 2024-12-10 NOTE — Addendum Note (Signed)
 Addended by: GENITA HARLENE CROME on: 12/10/2024 10:46 AM   Modules accepted: Orders

## 2024-12-12 ENCOUNTER — Other Ambulatory Visit: Payer: Self-pay | Admitting: Family Medicine

## 2024-12-12 DIAGNOSIS — K219 Gastro-esophageal reflux disease without esophagitis: Secondary | ICD-10-CM

## 2024-12-16 NOTE — Telephone Encounter (Signed)
 Requested Prescriptions  Pending Prescriptions Disp Refills   omeprazole  (PRILOSEC) 20 MG capsule [Pharmacy Med Name: OMEPRAZOLE  20MG  CAPSULES] 90 capsule 1    Sig: TAKE 1 CAPSULE(20 MG) BY MOUTH DAILY BEFORE BREAKFAST     Gastroenterology: Proton Pump Inhibitors Passed - 12/16/2024  9:45 AM      Passed - Valid encounter within last 12 months    Recent Outpatient Visits           3 months ago Benign prostatic hyperplasia with weak urinary stream   [facility] [place], Marsa PARAS, DO   9 months ago Annual physical exam   [place] [facility] Edman Marsa PARAS, DO       Future Appointments             In 3 months Edman, Marsa PARAS, DO [facility], [facility] [address]

## 2025-03-02 ENCOUNTER — Other Ambulatory Visit

## 2025-03-17 ENCOUNTER — Encounter: Admitting: Family Medicine
